# Patient Record
Sex: Male | Born: 1970 | Hispanic: Yes | Marital: Married | Smoking: Never smoker
Health system: Southern US, Community
[De-identification: ages and names within clinical notes are randomized; demographics above are authoritative.]

## PROBLEM LIST (undated history)

## (undated) DIAGNOSIS — E785 Hyperlipidemia, unspecified: Secondary | ICD-10-CM

## (undated) DIAGNOSIS — R1013 Epigastric pain: Secondary | ICD-10-CM

## (undated) DIAGNOSIS — M1990 Unspecified osteoarthritis, unspecified site: Secondary | ICD-10-CM

## (undated) DIAGNOSIS — K760 Fatty (change of) liver, not elsewhere classified: Secondary | ICD-10-CM

## (undated) DIAGNOSIS — G8929 Other chronic pain: Secondary | ICD-10-CM

## (undated) DIAGNOSIS — IMO0001 Reserved for inherently not codable concepts without codable children: Secondary | ICD-10-CM

## (undated) DIAGNOSIS — I10 Essential (primary) hypertension: Secondary | ICD-10-CM

## (undated) DIAGNOSIS — K824 Cholesterolosis of gallbladder: Secondary | ICD-10-CM

## (undated) DIAGNOSIS — A09 Infectious gastroenteritis and colitis, unspecified: Secondary | ICD-10-CM

## (undated) DIAGNOSIS — Z8711 Personal history of peptic ulcer disease: Secondary | ICD-10-CM

## (undated) DIAGNOSIS — R030 Elevated blood-pressure reading, without diagnosis of hypertension: Secondary | ICD-10-CM

## (undated) DIAGNOSIS — Z8719 Personal history of other diseases of the digestive system: Secondary | ICD-10-CM

## (undated) HISTORY — PX: UPPER GASTROINTESTINAL ENDOSCOPY: SHX188

## (undated) HISTORY — DX: Cholesterolosis of gallbladder: K82.4

## (undated) HISTORY — PX: NO PAST SURGERIES: SHX2092

## (undated) HISTORY — DX: Epigastric pain: R10.13

## (undated) HISTORY — DX: Infectious gastroenteritis and colitis, unspecified: A09

## (undated) HISTORY — DX: Reserved for inherently not codable concepts without codable children: IMO0001

## (undated) HISTORY — DX: Elevated blood-pressure reading, without diagnosis of hypertension: R03.0

## (undated) HISTORY — DX: Hyperlipidemia, unspecified: E78.5

## (undated) HISTORY — DX: Other chronic pain: G89.29

## (undated) HISTORY — DX: Personal history of peptic ulcer disease: Z87.11

## (undated) HISTORY — DX: Personal history of other diseases of the digestive system: Z87.19

---

## 2009-03-28 ENCOUNTER — Emergency Department (HOSPITAL_COMMUNITY): Admission: EM | Admit: 2009-03-28 | Discharge: 2009-03-28 | Payer: Self-pay | Admitting: Emergency Medicine

## 2009-04-30 ENCOUNTER — Ambulatory Visit: Payer: Self-pay | Admitting: Gastroenterology

## 2009-04-30 ENCOUNTER — Telehealth (INDEPENDENT_AMBULATORY_CARE_PROVIDER_SITE_OTHER): Payer: Self-pay | Admitting: *Deleted

## 2009-04-30 DIAGNOSIS — R1013 Epigastric pain: Secondary | ICD-10-CM

## 2009-05-14 ENCOUNTER — Encounter: Payer: Self-pay | Admitting: Gastroenterology

## 2009-05-14 ENCOUNTER — Ambulatory Visit: Payer: Self-pay | Admitting: Gastroenterology

## 2009-05-19 ENCOUNTER — Encounter: Payer: Self-pay | Admitting: Gastroenterology

## 2009-06-10 ENCOUNTER — Emergency Department (HOSPITAL_COMMUNITY): Admission: EM | Admit: 2009-06-10 | Discharge: 2009-06-10 | Payer: Self-pay | Admitting: Emergency Medicine

## 2010-12-03 LAB — CBC
MCHC: 34.5 g/dL (ref 30.0–36.0)
MCV: 84.8 fL (ref 78.0–100.0)
Platelets: 175 10*3/uL (ref 150–400)
RDW: 13.5 % (ref 11.5–15.5)
WBC: 6.6 10*3/uL (ref 4.0–10.5)

## 2010-12-03 LAB — COMPREHENSIVE METABOLIC PANEL
Alkaline Phosphatase: 57 U/L (ref 39–117)
GFR calc non Af Amer: >60 mL/min (ref >60)
Glucose, Bld: 99 mg/dL (ref 70–99)
Potassium: 3.7 mEq/L (ref 3.5–5.1)
Sodium: 141 mEq/L (ref 135–145)
Total Bilirubin: 0.6 mg/dL (ref 0.3–1.2)

## 2010-12-03 LAB — DIFFERENTIAL
Basophils Relative: 0 % (ref 0–1)
Eosinophils Relative: 2 % (ref 0–5)
Lymphocytes Relative: 37 % (ref 12–46)
Monocytes Absolute: 0.6 10*3/uL (ref 0.1–1.0)
Neutro Abs: 3.4 10*3/uL (ref 1.7–7.7)
Neutrophils Relative %: 52 % (ref 43–77)

## 2010-12-06 LAB — DIFFERENTIAL
Basophils Absolute: 0 10*3/uL (ref 0.0–0.1)
Basophils Relative: 0 % (ref 0–1)
Eosinophils Relative: 2 % (ref 0–5)
Lymphocytes Relative: 37 % (ref 12–46)
Lymphs Abs: 2.3 10*3/uL (ref 0.7–4.0)

## 2010-12-06 LAB — COMPREHENSIVE METABOLIC PANEL
ALT: 42 U/L (ref 0–53)
AST: 30 U/L (ref 0–37)
CO2: 29 mEq/L (ref 19–32)
Chloride: 104 mEq/L (ref 96–112)
GFR calc Af Amer: >60 mL/min (ref >60)
Glucose, Bld: 103 mg/dL — ABNORMAL HIGH (ref 70–99)
Potassium: 3.9 mEq/L (ref 3.5–5.1)
Sodium: 139 mEq/L (ref 135–145)

## 2010-12-06 LAB — URINALYSIS, ROUTINE W REFLEX MICROSCOPIC
Bilirubin Urine: NEGATIVE
Glucose, UA: NEGATIVE mg/dL
Hgb urine dipstick: NEGATIVE
Leukocytes, UA: NEGATIVE
Nitrite: NEGATIVE
Protein, ur: 30 mg/dL — AB
Specific Gravity, Urine: 1.028 (ref 1.005–1.030)
Urobilinogen, UA: 0.2 mg/dL (ref 0.0–1.0)

## 2010-12-06 LAB — CBC
Hemoglobin: 15.3 g/dL (ref 13.0–17.0)
WBC: 6.3 10*3/uL (ref 4.0–10.5)

## 2010-12-06 LAB — LIPASE, BLOOD: Lipase: 22 U/L (ref 11–59)

## 2010-12-06 LAB — URINE MICROSCOPIC-ADD ON

## 2012-07-30 ENCOUNTER — Emergency Department (HOSPITAL_COMMUNITY)
Admission: EM | Admit: 2012-07-30 | Discharge: 2012-07-30 | Disposition: A | Discharge status: Home / Self Care | Payer: Self-pay | Attending: Emergency Medicine | Admitting: Emergency Medicine

## 2012-07-30 ENCOUNTER — Encounter (HOSPITAL_COMMUNITY): Payer: Self-pay | Admitting: Emergency Medicine

## 2012-07-30 ENCOUNTER — Emergency Department (HOSPITAL_COMMUNITY): Payer: Self-pay

## 2012-07-30 DIAGNOSIS — S62639B Displaced fracture of distal phalanx of unspecified finger, initial encounter for open fracture: Secondary | ICD-10-CM

## 2012-07-30 DIAGNOSIS — S61313A Laceration without foreign body of left middle finger with damage to nail, initial encounter: Secondary | ICD-10-CM

## 2012-07-30 DIAGNOSIS — W230XXA Caught, crushed, jammed, or pinched between moving objects, initial encounter: Secondary | ICD-10-CM | POA: Insufficient documentation

## 2012-07-30 DIAGNOSIS — Y929 Unspecified place or not applicable: Secondary | ICD-10-CM | POA: Insufficient documentation

## 2012-07-30 DIAGNOSIS — Z23 Encounter for immunization: Secondary | ICD-10-CM | POA: Insufficient documentation

## 2012-07-30 DIAGNOSIS — Y9389 Activity, other specified: Secondary | ICD-10-CM | POA: Insufficient documentation

## 2012-07-30 DIAGNOSIS — S61209A Unspecified open wound of unspecified finger without damage to nail, initial encounter: Secondary | ICD-10-CM | POA: Insufficient documentation

## 2012-07-30 MED ORDER — LIDOCAINE HCL 2 % IJ SOLN
10.0000 mL | Freq: Once | INTRAMUSCULAR | Status: AC
  Administered 2012-07-30: 10 mg via INTRADERMAL
  Filled 2012-07-30: qty 20

## 2012-07-30 MED ORDER — OXYCODONE-ACETAMINOPHEN 5-325 MG PO TABS
1.0000 | ORAL_TABLET | Freq: Once | ORAL | Status: AC
  Administered 2012-07-30: 1 via ORAL
  Filled 2012-07-30: qty 1

## 2012-07-30 MED ORDER — OXYCODONE-ACETAMINOPHEN 5-325 MG PO TABS: 1.0000 | ORAL_TABLET | Freq: Four times a day (QID) | ORAL | Status: DC | PRN

## 2012-07-30 MED ORDER — CEPHALEXIN 500 MG PO CAPS: 500.0000 mg | ORAL_CAPSULE | Freq: Three times a day (TID) | ORAL | Status: DC

## 2012-07-30 MED ORDER — TETANUS-DIPHTH-ACELL PERTUSSIS 5-2.5-18.5 LF-MCG/0.5 IM SUSP
0.5000 mL | Freq: Once | INTRAMUSCULAR | Status: AC
  Administered 2012-07-30: 0.5 mL via INTRAMUSCULAR
  Filled 2012-07-30: qty 0.5

## 2012-07-30 NOTE — ED Notes (Signed)
Patient transported to X-ray 

## 2012-07-30 NOTE — ED Notes (Signed)
Pt states that he was fixing his garage door when he cut it.  Deep lac noted to lt middle finger.

## 2012-07-30 NOTE — ED Provider Notes (Signed)
History     CSN: [id number]  Arrival date & time 07/30/12  1352   First MD Initiated Contact with Patient 07/30/12 1420      Chief Complaint  Patient presents with  . Extremity Laceration    (Consider location/radiation/quality/duration/timing/severity/associated sxs/prior treatment) HPI  41 year old male presents for evaluation of finger laceration.  Pt is Spanish speaking, friend available at bedside acts as interpreter.  Pt reports he was fixing his garage door when it came of the hinge and the edge of the door smashed his L middle finger. Onset acute. Incident happened 30 min ago.  C/o sharp throbbing pain to affected area, but denies numbness.  Not UTD with tetanus.  No other injury.  No treatment tried.    History reviewed. No pertinent past medical history.  History reviewed. No pertinent past surgical history.  History reviewed. No pertinent family history.  History  Substance Use Topics  . Smoking status: Never Smoker   . Smokeless tobacco: Not on file  . Alcohol Use: No      Review of Systems  Constitutional: Negative for fever.  Skin: Positive for wound. Negative for rash.  Neurological: Negative for numbness.    Allergies  Review of patient's allergies indicates not on file.  Home Medications  No current outpatient prescriptions on file.  BP 139/90  Pulse 69  Temp 98.2 F (36.8 C) (Oral)  Resp 18  SpO2 99%  Physical Exam  Nursing note and vitals reviewed. Constitutional: He is oriented to person, place, and time. He appears well-developed and well-nourished. No distress.  HENT:  Head: Atraumatic.  Eyes: Conjunctivae normal are normal.  Neck: Neck supple.  Musculoskeletal: He exhibits tenderness (L middle finger: 2 cm circumferential laceration to medial aspect distal to DIP without nail involvement.  ttp.  brisk cap refill.  decreased ROM due to pain).  Neurological: He is alert and oriented to person, place, and time.  Skin: Skin is warm. No  rash noted.    ED Course  Procedures (including critical care time)  Dg Finger Middle Left  07/30/2012  *RADIOLOGY REPORT*  Clinical Data: Extremity and laceration.  Question gross door.  LEFT MIDDLE FINGER 2+V  Comparison: None.  Findings: There is a crush type injury of the tuft of the distal phalanx of the third finger.  The tuft is fractured into multiple small fragments, and distracted distally by approximately 3 mm. There is extensive soft tissue swelling and laceration adjacent to the fractures.   The joints of the hand are aligned.  IMPRESSION: Crush type comminuted fractures of the tuft of the distal phalanx of the third finger.  Adjacent soft tissue swelling/laceration.   Original Report Authenticated By: Britta Mccreedy, M.D.      LACERATION REPAIR Performed by: Fayrene Helper Authorized by: Fayrene Helper Consent: Verbal consent obtained. Risks and benefits: risks, benefits and alternatives were discussed Consent given by: patient Patient identity confirmed: provided demographic data Prepped and Draped in normal sterile fashion Wound explored  Laceration Location: L middle finger  Laceration Length: 2 cm  No Foreign Bodies seen or palpated  Anesthesia: digital block  Local anesthetic: lidocaine 2% w/out epinephrine  Anesthetic total: 8 ml  Irrigation method: syringe Amount of cleaning: standard  Skin closure: prolene 4.0  Number of sutures: 6  Technique: simple interrupted, sharp debridement using sterile scissor, approximation.    Patient tolerance: Patient tolerated the procedure well with no immediate complications.  1. Tuft fx of distal L 3rd finger, open fracture 2. Laceration  of L 3rd finger.    MDM  Pt injured his L middle finger when garage door hinge fell and the door smashed his finger.  Xray ordered, tdap given.    4:01 PM Xray shows crushed type comminuted fx of the tuft of the distal phalanx of the 3rd finger.  This is an open wound.  Wound were  thoroughly irrigated and sutured.  Will d/c with care instruction, finger splint, abx, pain meds, and f/u instruction to hand specialist.    BP 139/90  Pulse 69  Temp 98.2 F (36.8 C) (Oral)  Resp 18  SpO2 99%  I have reviewed nursing notes and vital signs. I personally reviewed the imaging tests through PACS system  I reviewed available ER/hospitalization records thought the EMR       Fayrene Helper, [place] 07/30/12 1603

## 2012-08-01 NOTE — ED Provider Notes (Signed)
Medical screening examination/treatment/procedure(s) were performed by non-physician practitioner and as supervising physician I was immediately available for consultation/collaboration.  Fredi Hurtado R. Victor Granados, MD 08/01/12 0011 

## 2014-05-22 ENCOUNTER — Encounter: Payer: Self-pay | Admitting: Gastroenterology

## 2014-06-14 ENCOUNTER — Ambulatory Visit (INDEPENDENT_AMBULATORY_CARE_PROVIDER_SITE_OTHER): Payer: BC Managed Care – PPO | Admitting: Internal Medicine

## 2014-06-14 ENCOUNTER — Encounter: Payer: Self-pay | Admitting: Internal Medicine

## 2014-06-14 VITALS — BP 147/89 | HR 77 | Temp 98.2°F | Ht 64.0 in | Wt 171.5 lb

## 2014-06-14 DIAGNOSIS — IMO0001 Reserved for inherently not codable concepts without codable children: Secondary | ICD-10-CM

## 2014-06-14 DIAGNOSIS — G8929 Other chronic pain: Secondary | ICD-10-CM

## 2014-06-14 DIAGNOSIS — R030 Elevated blood-pressure reading, without diagnosis of hypertension: Secondary | ICD-10-CM

## 2014-06-14 DIAGNOSIS — R1013 Epigastric pain: Secondary | ICD-10-CM

## 2014-06-14 DIAGNOSIS — I10 Essential (primary) hypertension: Secondary | ICD-10-CM | POA: Insufficient documentation

## 2014-06-14 HISTORY — DX: Other chronic pain: G89.29

## 2014-06-14 LAB — CBC WITH DIFFERENTIAL/PLATELET
BASOS PCT: 0.3 % (ref 0.0–3.0)
Basophils Absolute: 0 10*3/uL (ref 0.0–0.1)
Eosinophils Absolute: 0.2 10*3/uL (ref 0.0–0.7)
Eosinophils Relative: 2.1 % (ref 0.0–5.0)
HEMATOCRIT: 46.8 % (ref 39.0–52.0)
HEMOGLOBIN: 15.4 g/dL (ref 13.0–17.0)
Lymphocytes Relative: 33.6 % (ref 12.0–46.0)
Lymphs Abs: 2.8 10*3/uL (ref 0.7–4.0)
MCHC: 32.9 g/dL (ref 30.0–36.0)
MCV: 84.5 fl (ref 78.0–100.0)
MONO ABS: 0.5 10*3/uL (ref 0.1–1.0)
Monocytes Relative: 6.4 % (ref 3.0–12.0)
NEUTROS ABS: 4.8 10*3/uL (ref 1.4–7.7)
Neutrophils Relative %: 57.6 % (ref 43.0–77.0)
Platelets: 189 10*3/uL (ref 150.0–400.0)
RBC: 5.54 Mil/uL (ref 4.22–5.81)
RDW: 13.1 % (ref 11.5–15.5)
WBC: 8.4 10*3/uL (ref 4.0–10.5)

## 2014-06-14 LAB — COMPREHENSIVE METABOLIC PANEL
ALK PHOS: 57 U/L (ref 39–117)
ALT: 29 U/L (ref 0–53)
AST: 23 U/L (ref 0–37)
Albumin: 3.9 g/dL (ref 3.5–5.2)
BILIRUBIN TOTAL: 0.8 mg/dL (ref 0.2–1.2)
BUN: 10 mg/dL (ref 6–23)
CO2: 26 mEq/L (ref 19–32)
CREATININE: 0.9 mg/dL (ref 0.4–1.5)
Calcium: 9.6 mg/dL (ref 8.4–10.5)
Chloride: 101 mEq/L (ref 96–112)
GFR: 96.18 mL/min (ref >60.00)
GLUCOSE: 106 mg/dL — AB (ref 70–99)
Potassium: 3.7 mEq/L (ref 3.5–5.1)
Sodium: 138 mEq/L (ref 135–145)
Total Protein: 7.8 g/dL (ref 6.0–8.3)

## 2014-06-14 MED ORDER — PANTOPRAZOLE SODIUM 40 MG PO TBEC
40.0000 mg | DELAYED_RELEASE_TABLET | Freq: Every day | ORAL | Status: DC
Start: 2014-06-14 — End: 2014-11-08

## 2014-06-14 NOTE — Assessment & Plan Note (Addendum)
Several years history of abdominal pain, interestingly, EGD documented peptic ulcer per patient (~2010) Reports that both his father and brother died from stomach cancer. DDX is large but includes peptic ulcer disease, gastritis, others. Plan: Labs, abdominal ultrasound, Protonix, GI referral for consideration of endoscopy

## 2014-06-14 NOTE — Progress Notes (Signed)
Subjective:    Patient ID: Evan Freeman, male    DOB: 1971/05/31, 43 y.o.   MRN: [id number]  DOS:  06/14/2014 Type of visit - description : new pt 3 years history of abdominal pain, located at the epigastric area, feels like a burning, usually wakes him up in the middle of the night. Sometimes immediately after he has a bowel movement and then it decreases, bowel movements or sometimes loose or watery. He has a history of a ulcer years ago, more recently saw a doctor, was prescribed omeprazole, it helped to some extent. Abdominal pain is usually not changed by eating. On the last 2 weeks the symptoms have been present almost every night.    ROS Unsure if he had weight loss Denies chest or difficulty breathing No nausea vomiting. Admits to frequent heartburn, no dysphagia or odynophagia per se. No blood in the stools.   Past Medical History  Diagnosis Date  . Elevated BP   . Hyperlipidemia   . History of stomach ulcers     EGD 2010    Past Surgical History  Procedure Laterality Date  . No past surgeries      History   Social History  . Marital Status: Married    Spouse Name: N/A    Number of Children: 2  . Years of Education: N/A   Occupational History  . worker, Architect Other   Social History Main Topics  . Smoking status: Never Smoker   . Smokeless tobacco: Not on file  . Alcohol Use: Yes     Comment: rare  . Drug Use: No  . Sexual Activity: Not on file   Other Topics Concern  . Not on file   Social History Narrative   Original from [place]   7 grade education     Family History  Problem Relation Age of Onset  . Prostate cancer Neg Hx   . Colon cancer Neg Hx   . Gastric cancer Other     father, brother and cousin  . CAD Mother   . Diabetes Neg Hx        Medication List       This list is accurate as of: 06/14/14 11:59 PM.  Always use your most recent med list.               pantoprazole 40 MG tablet  Commonly known as:   PROTONIX  Take 1 tablet (40 mg total) by mouth daily.           Objective:   Physical Exam BP 147/89  Pulse 77  Temp(Src) 98.2 F (36.8 C) (Oral)  Ht 5\' 4"  (1.626 m)  Wt 171 lb 8 oz (77.792 kg)  BMI 29.42 kg/m2  SpO2 94% General -- alert, well-developed, NAD.  HEENT-- Not pale.   Lungs -- normal respiratory effort, no intercostal retractions, no accessory muscle use, and normal breath sounds.  Heart-- normal rate, regular rhythm, no murmur.  Abdomen-- Not distended, good bowel sounds,soft,  slt tender @ epigastrium but nNo rebound or rigidity. No mass,organomegaly Extremities-- no pretibial edema bilaterally  Neurologic--  alert & oriented X3. Speech normal, gait appropriate for age, strength symmetric and appropriate for age.   Psych-- Cognition and judgment appear intact. Cooperative with normal attention span and concentration. No anxious or depressed appearing.        Assessment & Plan:   Also complained of  R arm numbness from time to time, usually dependent on  his position; for  instance when he drives  for prolonged periods of time. That is not his main concern at this time and will address in a future visit. Denies neck pain

## 2014-06-14 NOTE — Progress Notes (Signed)
Pre visit review using our clinic review tool, if applicable. No additional management support is needed unless otherwise documented below in the visit note. 

## 2014-06-14 NOTE — Patient Instructions (Signed)
Get your blood work before you leave     Please come back to the office in 3 months for a routine check up   [place] para una cita con el especialista del estomago  Debe hacerse un ultrasonido de el abdomen   regrese en 2 meses  Tome pantoprazole todos los dias antes del desayuno

## 2014-06-15 NOTE — Assessment & Plan Note (Signed)
Recheck on RTC

## 2014-06-18 ENCOUNTER — Encounter: Payer: Self-pay | Admitting: Internal Medicine

## 2014-06-18 ENCOUNTER — Ambulatory Visit (HOSPITAL_BASED_OUTPATIENT_CLINIC_OR_DEPARTMENT_OTHER): Payer: BC Managed Care – PPO

## 2014-07-05 ENCOUNTER — Ambulatory Visit (HOSPITAL_BASED_OUTPATIENT_CLINIC_OR_DEPARTMENT_OTHER)
Admission: RE | Admit: 2014-07-05 | Discharge: 2014-07-05 | Disposition: A | Discharge status: Home / Self Care | Payer: BC Managed Care – PPO | Source: Ambulatory Visit | Attending: Radiology | Admitting: Radiology

## 2014-07-05 DIAGNOSIS — G8929 Other chronic pain: Secondary | ICD-10-CM

## 2014-07-05 DIAGNOSIS — K829 Disease of gallbladder, unspecified: Secondary | ICD-10-CM | POA: Diagnosis not present

## 2014-07-05 DIAGNOSIS — N281 Cyst of kidney, acquired: Secondary | ICD-10-CM | POA: Diagnosis not present

## 2014-07-05 DIAGNOSIS — K760 Fatty (change of) liver, not elsewhere classified: Secondary | ICD-10-CM | POA: Diagnosis not present

## 2014-07-05 DIAGNOSIS — R1013 Epigastric pain: Secondary | ICD-10-CM | POA: Diagnosis present

## 2014-08-05 ENCOUNTER — Ambulatory Visit (INDEPENDENT_AMBULATORY_CARE_PROVIDER_SITE_OTHER): Payer: BC Managed Care – PPO | Admitting: Gastroenterology

## 2014-08-05 ENCOUNTER — Encounter: Payer: Self-pay | Admitting: Gastroenterology

## 2014-08-05 VITALS — BP 136/82 | HR 74 | Ht 63.75 in | Wt 174.0 lb

## 2014-08-05 DIAGNOSIS — R1013 Epigastric pain: Secondary | ICD-10-CM

## 2014-08-05 DIAGNOSIS — G8929 Other chronic pain: Secondary | ICD-10-CM

## 2014-08-05 DIAGNOSIS — K824 Cholesterolosis of gallbladder: Secondary | ICD-10-CM

## 2014-08-05 HISTORY — DX: Cholesterolosis of gallbladder: K82.4

## 2014-08-05 MED ORDER — GLYCOPYRROLATE 2 MG PO TABS: ORAL_TABLET | ORAL | Status: DC

## 2014-08-05 NOTE — Patient Instructions (Signed)

## 2014-08-05 NOTE — Addendum Note (Signed)
Addended by: Oda Kilts on: 08/05/2014 03:59 PM   Modules accepted: Orders

## 2014-08-05 NOTE — Assessment & Plan Note (Signed)
This is probably an incidental finding.  Polyps less than 10 mm usually follow a benign course.  Since the incidence of gallbladder cancer increases in polyps exceeding 10 mm would recommend elective cholecystectomy, should this occur.  In the interim plan to repeat ultrasound in 6 months to evaluate polyp size.

## 2014-08-05 NOTE — Progress Notes (Signed)
_                                                                                                                History of Present Illness:  Mr. Evan Freeman is a 43 year old Hispanic male referred for evaluation of abdominal pain.  History was obtained through translator.  For several years he's been complaining of vague upper abdominal pain that may radiate to the umbilicus.  He often awakens with pain.  It is not particularly affected by eating or by position.  He's on no gastric irritants including nonsteroidals.  Empiric therapy with pantoprazole resulted in decrease in pyrosis but no change in the pain.  Abdominal ultrasound, which I reviewed, demonstrated a probable 8 mm gallbladder polyp.  There was mild hepatic steatosis.  He apparently had a gastric ulcer over 5 years ago diagnosed by endoscopy.  Family history is pertinent for a brother and father who both had gastric cancer in their 79s.  The patient immigrated from [place] several years ago.   Past Medical History  Diagnosis Date  . Elevated BP   . Hyperlipidemia   . History of stomach ulcers     EGD 2010  . Infectious colitis    Past Surgical History  Procedure Laterality Date  . Ulcer surgery     family history includes CAD in his mother; Gastric cancer in his other; Stomach cancer in his brother and father. There is no history of Prostate cancer, Colon cancer, Diabetes, or Esophageal cancer. Current Outpatient Prescriptions  Medication Sig Dispense Refill  . pantoprazole (PROTONIX) 40 MG tablet Take 1 tablet (40 mg total) by mouth daily. 30 tablet 3   No current facility-administered medications for this visit.   Allergies as of 08/05/2014  . (No Known Allergies)    reports that he has never smoked. He has never used smokeless tobacco. He reports that he drinks alcohol. He reports that he does not use illicit drugs.   Review of Systems: Pertinent positive and negative review of systems were noted in  the above HPI section. All other review of systems were otherwise negative.  Vital signs were reviewed in today's medical record Physical Exam: General: Well developed , well nourished, no acute distress Skin: anicteric Head: Normocephalic and atraumatic Eyes:  sclerae anicteric, EOMI Ears: Normal auditory acuity Mouth: No deformity or lesions Neck: Supple, no masses or thyromegaly Lungs: Clear throughout to auscultation Heart: Regular rate and rhythm; no murmurs, rubs or bruits Abdomen: Soft, and non distended. No masses, hepatosplenomegaly or hernias noted. Normal Bowel sounds.  There is minimal tenderness to palpation in the midepigastrium Rectal:deferred Musculoskeletal: Symmetrical with no gross deformities  Skin: No lesions on visible extremities Pulses:  Normal pulses noted Extremities: No clubbing, cyanosis, edema or deformities noted Neurological: Alert oriented x 4, grossly nonfocal Cervical Nodes:  No significant cervical adenopathy Inguinal Nodes: No significant inguinal adenopathy Psychological:  Alert and cooperative. Normal mood and affect  See Assessment and Plan under Problem List

## 2014-08-05 NOTE — Assessment & Plan Note (Signed)
Several year history of low-grade but persistent epigastric pain.  Symptoms could be due to ulcer or nonulcer dyspepsia.  Patient has a gallbladder polyp but I think this is an incidental finding.  No family history of gastric cancer in father and brother in their early 64s.  Recommendations #1 upper endoscopy #2 trial of hyomax #3 if not improved and endoscopy is negative I will obtain a HIDA scan

## 2014-08-15 ENCOUNTER — Encounter: Payer: Self-pay | Admitting: Gastroenterology

## 2014-09-27 ENCOUNTER — Encounter: Payer: Self-pay | Admitting: Gastroenterology

## 2014-09-27 ENCOUNTER — Ambulatory Visit (AMBULATORY_SURGERY_CENTER): Payer: 59 | Admitting: Gastroenterology

## 2014-09-27 VITALS — BP 145/99 | HR 57 | Temp 97.2°F | Resp 17 | Ht 63.5 in | Wt 174.0 lb

## 2014-09-27 DIAGNOSIS — R1013 Epigastric pain: Secondary | ICD-10-CM

## 2014-09-27 DIAGNOSIS — K2990 Gastroduodenitis, unspecified, without bleeding: Secondary | ICD-10-CM

## 2014-09-27 DIAGNOSIS — K2970 Gastritis, unspecified, without bleeding: Secondary | ICD-10-CM

## 2014-09-27 DIAGNOSIS — K2950 Unspecified chronic gastritis without bleeding: Secondary | ICD-10-CM

## 2014-09-27 MED ORDER — SODIUM CHLORIDE 0.9 % IV SOLN: 500.0000 mL | INTRAVENOUS | Status: DC

## 2014-09-27 NOTE — Progress Notes (Signed)
Called to room to assist during endoscopic procedure.  Patient ID and intended procedure confirmed with present staff. Received instructions for my participation in the procedure from the performing physician.  

## 2014-09-27 NOTE — Progress Notes (Signed)
Report to PACU, RN, vss, BBS= Clear.  

## 2014-09-27 NOTE — Op Note (Signed)
[facility]  [address] [place], [address]   ENDOSCOPY PROCEDURE REPORT  PATIENT: Evan, Freeman  MR#: [id number] BIRTHDATE: [birth year] , 43  yrs. old GENDER: male ENDOSCOPIST: Inda Castle, MD REFERRED BY:  Kathlene November, M.D. PROCEDURE DATE:  09/27/2014 PROCEDURE:  EGD w/ biopsy ASA CLASS:     Class II INDICATIONS:  epigastric pain. MEDICATIONS: Monitored anesthesia care and Propofol 200 mg IV TOPICAL ANESTHETIC:  DESCRIPTION OF PROCEDURE: After the risks benefits and alternatives of the procedure were thoroughly explained, informed consent was obtained.  The LB [id number] endoscope was introduced through the mouth and advanced to the second portion of the duodenum , Without limitations.  The instrument was slowly withdrawn as the mucosa was fully examined.    Enlarged gastric folds with erosions, fundus.   Enlarged gastric folds, fundus.   Gastric erosions.  In the gastric fundus there is several enlarged but soft.  Erythematous with multiple superficial erosions Multiple biopsies were performed.  Retroflexed views revealed no abnormalities.     The scope was then withdrawn from the patient and the procedure completed.  COMPLICATIONS: There were no immediate complications.  ENDOSCOPIC IMPRESSION: 1.   Enlarged gastric folds with erosions, fundus 2.   Enlarged gastric folds, fundus 3.   Gastric erosions 4.   Multiple biopsies were performed  RECOMMENDATIONS: 1.  Continue current meds 2.  Await biopsy results  REPEAT EXAM:  eSigned:  Inda Castle, MD 09/27/2014 2:38 PM    CC:  PATIENT NAME:  Evan Freeman, Evan Freeman MR#: [id number]

## 2014-09-27 NOTE — Patient Instructions (Signed)
Impressions/recommendations:  Gastric erosions Pending biopsy results.  Continue current medications YOU HAD AN ENDOSCOPIC PROCEDURE TODAY AT [facility]: Refer to the procedure report that was given to you for any specific questions about what was found during the examination.  If the procedure report does not answer your questions, please call your gastroenterologist to clarify.  If you requested that your care partner not be given the details of your procedure findings, then the procedure report has been included in a sealed envelope for you to review at your convenience later.  YOU SHOULD EXPECT: Some feelings of bloating in the abdomen. Passage of more gas than usual.  Walking can help get rid of the air that was put into your GI tract during the procedure and reduce the bloating. If you had a lower endoscopy (such as a colonoscopy or flexible sigmoidoscopy) you may notice spotting of blood in your stool or on the toilet paper. If you underwent a bowel prep for your procedure, then you may not have a normal bowel movement for a few days.  DIET: Your first meal following the procedure should be a light meal and then it is ok to progress to your normal diet.  A half-sandwich or bowl of soup is an example of a good first meal.  Heavy or fried foods are harder to digest and may make you feel nauseous or bloated.  Likewise meals heavy in dairy and vegetables can cause extra gas to form and this can also increase the bloating.  Drink plenty of fluids but you should avoid alcoholic beverages for 24 hours.  ACTIVITY: Your care partner should take you home directly after the procedure.  You should plan to take it easy, moving slowly for the rest of the day.  You can resume normal activity the day after the procedure however you should NOT DRIVE or use heavy machinery for 24 hours (because of the sedation medicines used during the test).    SYMPTOMS TO REPORT IMMEDIATELY: A  gastroenterologist can be reached at any hour.  During normal business hours, 8:30 AM to 5:00 PM Monday through Friday, call [phone].  After hours and on weekends, please call the GI answering service at [phone] who will take a message and have the physician on call contact you.   Following upper endoscopy (EGD)  Vomiting of blood or coffee ground material  New chest pain or pain under the shoulder blades  Painful or persistently difficult swallowing  New shortness of breath  Fever of 100F or higher  Black, tarry-looking stools  FOLLOW UP: If any biopsies were taken you will be contacted by phone or by letter within the next 1-3 weeks.  Call your gastroenterologist if you have not heard about the biopsies in 3 weeks.  Our staff will call the home number listed on your records the next business day following your procedure to check on you and address any questions or concerns that you may have at that time regarding the information given to you following your procedure. This is a courtesy call and so if there is no answer at the home number and we have not heard from you through the emergency physician on call, we will assume that you have returned to your regular daily activities without incident.  SIGNATURES/CONFIDENTIALITY: You and/or your care partner have signed paperwork which will be entered into your electronic medical record.  These signatures attest to the fact that that the information above on your After  Visit Summary has been reviewed and is understood.  Full responsibility of the confidentiality of this discharge information lies with you and/or your care-partner. 

## 2014-09-30 ENCOUNTER — Telehealth: Payer: Self-pay | Admitting: *Deleted

## 2014-09-30 NOTE — Telephone Encounter (Signed)
  Follow up Call-  Call back number 09/27/2014  Post procedure Call Back phone  # [phone]  Permission to leave phone message Yes     Patient questions:  Do you have a fever, pain , or abdominal swelling? No. Pain Score  0 *  Have you tolerated food without any problems? Yes.    Have you been able to return to your normal activities? Yes.    Do you have any questions about your discharge instructions: Diet   No. Medications  No. Follow up visit  No.  Do you have questions or concerns about your Care? No.  Actions: * If pain score is 4 or above: No action needed, pain <4.

## 2014-10-02 ENCOUNTER — Encounter: Payer: Self-pay | Admitting: Gastroenterology

## 2014-11-06 ENCOUNTER — Encounter (HOSPITAL_COMMUNITY): Payer: Self-pay | Admitting: Emergency Medicine

## 2014-11-08 ENCOUNTER — Encounter: Payer: Self-pay | Admitting: Internal Medicine

## 2014-11-08 ENCOUNTER — Ambulatory Visit (INDEPENDENT_AMBULATORY_CARE_PROVIDER_SITE_OTHER): Payer: 59 | Admitting: Internal Medicine

## 2014-11-08 VITALS — BP 138/76 | HR 70 | Temp 98.2°F | Ht 64.0 in | Wt 176.4 lb

## 2014-11-08 DIAGNOSIS — M7710 Lateral epicondylitis, unspecified elbow: Secondary | ICD-10-CM

## 2014-11-08 DIAGNOSIS — R1013 Epigastric pain: Secondary | ICD-10-CM

## 2014-11-08 MED ORDER — PANTOPRAZOLE SODIUM 40 MG PO TBEC: 40.0000 mg | DELAYED_RELEASE_TABLET | Freq: Every day | ORAL | Status: DC

## 2014-11-08 NOTE — Progress Notes (Signed)
Pre visit review using our clinic review tool, if applicable. No additional management support is needed unless otherwise documented below in the visit note. 

## 2014-11-08 NOTE — Progress Notes (Signed)
   Subjective:    Patient ID: Evan Freeman, male    DOB: 1971/01/25, 44 y.o.   MRN: [id number]  DOS:  11/08/2014 Type of visit - description : f/u Interval history: Was seen with abdominal pain, saw GI, chart reviewed. Also continue with pain at the right elbow, mostly when he has to use his hands. Sometimes has right hand numbness, the whole hand gets numb, usually related to heavy work with his hands such as grabbing or using a hammer.    Review of Systems No neck pain per se. No elbow or hand injury  Past Medical History  Diagnosis Date  . Elevated BP   . Hyperlipidemia   . History of stomach ulcers     EGD 2010  . Infectious colitis     Past Surgical History  Procedure Laterality Date  . Ulcer surgery      History   Social History  . Marital Status: Married    Spouse Name: N/A  . Number of Children: 2  . Years of Education: N/A   Occupational History  . worker, Architect Other   Social History Main Topics  . Smoking status: Never Smoker   . Smokeless tobacco: Never Used  . Alcohol Use: 0.0 oz/week    0 Standard drinks or equivalent per week     Comment: rare  . Drug Use: No  . Sexual Activity: Not on file   Other Topics Concern  . Not on file   Social History Narrative   ** Merged History Encounter **       Original from [place]   7 grade education        Medication List       This list is accurate as of: 11/08/14 11:59 PM.  Always use your most recent med list.               glycopyrrolate 2 MG tablet  Commonly known as:  ROBINUL  Take one tablet 3 times a day for 5 days then as needed, abdominal pain     pantoprazole 40 MG tablet  Commonly known as:  PROTONIX  Take 1 tablet (40 mg total) by mouth daily.           Objective:   Physical Exam BP 138/76 mmHg  Pulse 70  Temp(Src) 98.2 F (36.8 C) (Oral)  Ht 5\' 4"  (1.626 m)  Wt 176 lb 6 oz (80.003 kg)  BMI 30.26 kg/m2  SpO2 98%  General:   Well developed, well  nourished . NAD.  Neck-- full range of motion, no TTP Muscle skeletal: no pretibial edema bilaterally  Inspection, palpation and range of motion of hands, elbows and wrists normal Skin: Not pale. Not jaundice Neurologic:  alert & oriented X3.  Speech normal, gait appropriate for age and unassisted DTRs symmetric Psych--  Cognition and judgment appear intact.  Cooperative with normal attention span and concentration.  Behavior appropriate. No anxious or depressed appearing.      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 Recommend a trial with CTS brace.

## 2014-11-08 NOTE — Patient Instructions (Addendum)
consiga un: Tennis Elbow Brace y uselo en el brazo derecho tambien un Carpal tunnel syndrome brace , uselo en la noche   si no se mejora, llamenos en 2 meses  regrese a hacerse un examen fisico en 6 meses     Come back to the office in 6 months  for a physical exam  Please schedule an appointment at the front desk    Come back fasting

## 2014-11-08 NOTE — Assessment & Plan Note (Addendum)
Since the last visit, had EGD, it showed erosions, biopsy showed no H. pylori, he has chronic erosive gastrittis. Ultrasound show a gallbladder polyp, felt to be incidental finding. Also fatty liver, this diagnosis was discussed with the patient. Taking PPIs, feels much improved. Wonders if he needs to continue with PPIs Plan: Refill PPIs for one month, will communicate with GI, continue pantoprazole? Addendum, discuss with GI. Will switch PPIs  to every other day and then as needed. See letter

## 2014-11-13 ENCOUNTER — Telehealth: Payer: Self-pay

## 2014-11-13 NOTE — Telephone Encounter (Signed)
Letter printed and mailed to Pt.  

## 2014-11-13 NOTE — Telephone Encounter (Signed)
-----   Message from Colon Branch, MD sent at 11/13/2014  8:44 AM EDT ----- Regarding: Send a letter "PPI instructions" Evan Freeman, I talked with our GI specialist, the recommendation is to take Protonix every other day for the next month and then only as needed for stomach problems. If   you have increased symptoms, please let us know. HABLE CON EL ESPECIALISTA , ME DIJO QUE DEBES TOMAR PROTONIX CADA OTRO DIA POR UN MES, [facility] SOLO SI LO [name]. SI LOS PROBLEMAS DE [name]

## 2014-12-20 ENCOUNTER — Telehealth: Payer: Self-pay | Admitting: *Deleted

## 2014-12-20 ENCOUNTER — Encounter: Payer: Self-pay | Admitting: Gastroenterology

## 2014-12-20 DIAGNOSIS — K824 Cholesterolosis of gallbladder: Secondary | ICD-10-CM

## 2014-12-20 NOTE — Telephone Encounter (Signed)
==  View-only below this line===  ----- Message -----    From: Oda Kilts, CMA    Sent: 11/29/2014      To: Oda Kilts, CMA Subject: Ultrasound in 5 months                         Patient needs 5 month follow up US abdomen for gallbladder polyp    You have been scheduled for an abdominal ultrasound at [facility] (1st floor of hospital) on May 3  at [place]. Please arrive 15 minutes prior to your appointment for registration. Make certain not to have anything to eat or drink 6 hours prior to your appointment. Should you need to reschedule your appointment, please contact radiology at [phone]. This test typically takes about 30 minutes to perform.   Could not get in touch with the patient sent letter to patient today

## 2014-12-31 ENCOUNTER — Ambulatory Visit (HOSPITAL_COMMUNITY): Payer: 59

## 2015-04-21 ENCOUNTER — Telehealth: Payer: Self-pay | Admitting: Internal Medicine

## 2015-04-21 NOTE — Telephone Encounter (Signed)
Pre visit letter mailed 04/21/15

## 2015-05-09 ENCOUNTER — Telehealth: Payer: Self-pay | Admitting: *Deleted

## 2015-05-09 ENCOUNTER — Encounter: Payer: Self-pay | Admitting: *Deleted

## 2015-05-09 NOTE — Telephone Encounter (Signed)
Pre-Visit Call completed with patient and [place] # [phone] and chart updated.   Pre-Visit Info documented in Specialty Comments under SnapShot.

## 2015-05-12 ENCOUNTER — Ambulatory Visit (INDEPENDENT_AMBULATORY_CARE_PROVIDER_SITE_OTHER): Payer: 59 | Admitting: Internal Medicine

## 2015-05-12 ENCOUNTER — Encounter: Payer: Self-pay | Admitting: Internal Medicine

## 2015-05-12 VITALS — BP 106/74 | HR 58 | Temp 98.4°F | Ht 64.0 in | Wt 182.2 lb

## 2015-05-12 DIAGNOSIS — Z0000 Encounter for general adult medical examination without abnormal findings: Secondary | ICD-10-CM | POA: Diagnosis not present

## 2015-05-12 DIAGNOSIS — Z09 Encounter for follow-up examination after completed treatment for conditions other than malignant neoplasm: Secondary | ICD-10-CM

## 2015-05-12 DIAGNOSIS — R739 Hyperglycemia, unspecified: Secondary | ICD-10-CM

## 2015-05-12 DIAGNOSIS — Z114 Encounter for screening for human immunodeficiency virus [HIV]: Secondary | ICD-10-CM

## 2015-05-12 LAB — LIPID PANEL
CHOL/HDL RATIO: 6
CHOLESTEROL: 195 mg/dL (ref 0–200)
HDL: 34.5 mg/dL — ABNORMAL LOW (ref >39.00)
NONHDL: 160.35
Triglycerides: 231 mg/dL — ABNORMAL HIGH (ref 0.0–149.0)
VLDL: 46.2 mg/dL — ABNORMAL HIGH (ref 0.0–40.0)

## 2015-05-12 LAB — AST: AST: 22 U/L (ref 0–37)

## 2015-05-12 LAB — HIV ANTIBODY (ROUTINE TESTING W REFLEX): HIV: NONREACTIVE

## 2015-05-12 LAB — TSH: TSH: 1.06 u[IU]/mL (ref 0.35–4.50)

## 2015-05-12 LAB — LDL CHOLESTEROL, DIRECT: Direct LDL: 105 mg/dL

## 2015-05-12 LAB — HEMOGLOBIN A1C: HEMOGLOBIN A1C: 5.9 % (ref 4.6–6.5)

## 2015-05-12 LAB — ALT: ALT: 33 U/L (ref 0–53)

## 2015-05-12 MED ORDER — PANTOPRAZOLE SODIUM 40 MG PO TBEC: 40.0000 mg | DELAYED_RELEASE_TABLET | Freq: Every day | ORAL | Status: DC

## 2015-05-12 NOTE — Progress Notes (Signed)
Subjective:    Patient ID: Evan Freeman, male    DOB: 1971-03-19, 44 y.o.   MRN: [id number]  DOS:  05/12/2015 Type of visit - description :  CPX Interval history:  Has some concerns, see review f systems    Review of Systems  Constitutional: No fever. No chills. No unexplained wt changes. No unusual sweats  HEENT: No dental problems, no ear discharge, no facial swelling, no voice changes. No eye discharge, no eye  redness , no  intolerance to light   Respiratory: No wheezing , no  difficulty breathing. No cough , no mucus production  Cardiovascular: No CP, no leg swelling , no  Palpitations  GI: no nausea, no vomiting, no diarrhea Continue with occasional epigastric discomfort, not taking PPIs, no classic heartburn, reports a verybad taste in mouth every morning.   Endocrine: No polyphagia, no polyuria , no polydipsia  GU: No dysuria, gross hematuria, difficulty urinating. No urinary urgency, no frequency.  Musculoskeletal: No joint swellings , some pain at the right lower back for 10 days, no radiation. Skin: No change in the color of the skin, palor , no  Rash  Allergic, immunologic: No environmental allergies , no  food allergies  Neurological: No dizziness no  syncope. No headaches. No diplopia, no slurred, no slurred speech, no motor deficits. Continue with occasional right hand numbness, mostly at night, at both the radial and cubital sides.  Denies any neck, elbow or wrist pain.  Hematological: No enlarged lymph nodes, no easy bruising , no unusual bleedings  Psychiatry: No suicidal ideas, no hallucinations, no beavior problems, no confusion.  No unusual/severe anxiety, no depression    Past Medical History  Diagnosis Date  . Elevated BP   . Hyperlipidemia   . History of stomach ulcers     EGD 2010  . Infectious colitis     Past Surgical History  Procedure Laterality Date  . No past surgeries      Social History   Social History  . Marital  Status: Married    Spouse Name: N/A  . Number of Children: 2  . Years of Education: N/A   Occupational History  . worker, Architect Other   Social History Main Topics  . Smoking status: Never Smoker   . Smokeless tobacco: Never Used  . Alcohol Use: 0.0 oz/week    0 Standard drinks or equivalent per week     Comment: rare  . Drug Use: No  . Sexual Activity: Not on file   Other Topics Concern  . Not on file   Social History Narrative   Original from [place]   7 grade education   Household-- pt , wife, daughter; has an adult son     Family History  Problem Relation Age of Onset  . Prostate cancer Neg Hx   . Colon cancer Neg Hx   . Gastric cancer Other     father, brother and cousin  . CAD Mother   . Diabetes Neg Hx   . Esophageal cancer Neg Hx   . Stomach cancer Father   . Stomach cancer Brother       Medication List       This list is accurate as of: 05/12/15 11:59 PM.  Always use your most recent med list.               pantoprazole 40 MG tablet  Commonly known as:  PROTONIX  Take 1 tablet (40 mg total) by mouth daily.  Objective:   Physical Exam BP 106/74 mmHg  Pulse 58  Temp(Src) 98.4 F (36.9 C) (Oral)  Ht 5\' 4"  (1.626 m)  Wt 182 lb 4 oz (82.668 kg)  BMI 31.27 kg/m2  SpO2 97% General:   Well developed, well nourished . NAD.  Neck:  Full range of motion. Supple. No  thyromegaly , normal carotid pulse HEENT:  Normocephalic . Face symmetric, atraumatic Lungs:  CTA B Normal respiratory effort, no intercostal retractions, no accessory muscle use. Heart: RRR,  no murmur.  No pretibial edema bilaterally  Abdomen:  Not distended, soft, non-tender. No rebound or rigidity.  Skin: Exposed areas without rash. Not pale. Not jaundice Neurologic:  alert & oriented X3.  Speech normal, gait appropriate for age and unassisted Strength symmetric and appropriate for age.    Psych: Cognition and judgment appear intact.  Cooperative with  normal attention span and concentration.  Behavior appropriate. No anxious or depressed appearing.    Assessment & Plan:   Problem List > Dyslipidemia H/o Elevated BP Chronic epigastric pain --EGD C~2010 PUD per Pt --CT abd neg 2010 --EGD 08-2014 Dr Deatra Ina: gastric erosions (bx chronic gastritis HP neg) , Rx PPI x 3 months , then prn Gallbladder polyps per [place] 06-2014, saw GI, incidental finding Fatty liver per [place] 06-2014, normal LFTs  A/P Chronic epigastric pain, likely from chronic gastritis, recommend PPIs Carpal tunnel syndrome? Has chronic numbness in the right hand, recommend CTS brace. Patient states symptoms are mild and declined further eval. Gallbladder polyps: 8 millimeters per report, repeat ultrasound 07-2015.

## 2015-05-12 NOTE — Patient Instructions (Addendum)
Get your blood work before you leave     Next visit  for a physical  exam in one year  Please schedule an appointment at the front desk Please come back fasting       Tome pantoprazole todos los dias antes del desayuno  Le vamos a tomar un ultrasonido en noviembre  regrese el proximo an~o para su examen genera;

## 2015-05-12 NOTE — Progress Notes (Signed)
Pre visit review using our clinic review tool, if applicable. No additional management support is needed unless otherwise documented below in the visit note. 

## 2015-05-12 NOTE — Assessment & Plan Note (Signed)
Td 2013, declined a flu shot Colon cancer screening: Not indicated Prostate cancer screening: Not indicated Diet and exercise discussed Labs: Cholesterol, AST, ALT, TSH, HIV and A1c.

## 2015-05-13 DIAGNOSIS — Z09 Encounter for follow-up examination after completed treatment for conditions other than malignant neoplasm: Secondary | ICD-10-CM | POA: Insufficient documentation

## 2015-05-13 NOTE — Assessment & Plan Note (Signed)
Chronic epigastric pain, likely from chronic gastritis, recommend PPIs Carpal tunnel syndrome? Has chronic numbness in the right hand, recommend CTS brace. Patient states symptoms are mild and declined further eval. Gallbladder polyps: 8 millimeters per report, repeat ultrasound 07-2015.

## 2015-08-10 ENCOUNTER — Telehealth: Payer: Self-pay | Admitting: Internal Medicine

## 2015-08-10 DIAGNOSIS — K824 Cholesterolosis of gallbladder: Secondary | ICD-10-CM

## 2015-08-10 NOTE — Telephone Encounter (Signed)
The patient needs a ultrasound of the abdomen, please arrange. I believe the  order is already in, if that is not the case let me know

## 2015-10-13 NOTE — Telephone Encounter (Signed)
Apparently nobody called him, please arrange

## 2015-10-14 NOTE — Telephone Encounter (Signed)
Looks like US Abdomen Complete was ordered by Dr. Deatra Ina in 11/2014. I have reordered the ultrasound under your name.

## 2015-10-14 NOTE — Telephone Encounter (Signed)
thx

## 2015-10-17 ENCOUNTER — Ambulatory Visit (HOSPITAL_BASED_OUTPATIENT_CLINIC_OR_DEPARTMENT_OTHER): Payer: Self-pay

## 2015-11-28 ENCOUNTER — Ambulatory Visit (HOSPITAL_BASED_OUTPATIENT_CLINIC_OR_DEPARTMENT_OTHER)
Admission: RE | Admit: 2015-11-28 | Discharge: 2015-11-28 | Disposition: A | Discharge status: Home / Self Care | Payer: BLUE CROSS/BLUE SHIELD | Source: Ambulatory Visit | Attending: Internal Medicine | Admitting: Internal Medicine

## 2015-11-28 DIAGNOSIS — K824 Cholesterolosis of gallbladder: Secondary | ICD-10-CM | POA: Diagnosis present

## 2016-05-14 ENCOUNTER — Encounter: Payer: Self-pay | Admitting: Internal Medicine

## 2016-06-01 ENCOUNTER — Encounter: Payer: BLUE CROSS/BLUE SHIELD | Admitting: Internal Medicine

## 2016-06-01 ENCOUNTER — Encounter: Payer: Self-pay | Admitting: Internal Medicine

## 2016-06-01 ENCOUNTER — Ambulatory Visit (INDEPENDENT_AMBULATORY_CARE_PROVIDER_SITE_OTHER): Payer: BLUE CROSS/BLUE SHIELD | Admitting: Internal Medicine

## 2016-06-01 VITALS — BP 120/76 | HR 72 | Temp 98.1°F | Resp 14 | Ht 64.0 in | Wt 182.1 lb

## 2016-06-01 DIAGNOSIS — Z0001 Encounter for general adult medical examination with abnormal findings: Secondary | ICD-10-CM

## 2016-06-01 DIAGNOSIS — Z0000 Encounter for general adult medical examination without abnormal findings: Secondary | ICD-10-CM

## 2016-06-01 DIAGNOSIS — M542 Cervicalgia: Secondary | ICD-10-CM

## 2016-06-01 MED ORDER — PREDNISONE 10 MG PO TABS: ORAL_TABLET | ORAL | 0 refills | Status: DC

## 2016-06-01 NOTE — Assessment & Plan Note (Addendum)
Td 2013, declined a flu shot Colon cancer screening: Not indicated Prostate cancer screening: Not indicated Diet and exercise discussed Labs: FLP, CMP, CBC, A1c (not fasting)

## 2016-06-01 NOTE — Patient Instructions (Addendum)
GO TO THE LAB : Get the blood work     GO TO THE FRONT DESK Schedule your next appointment for a  complete physical exam in one year, fasting.  ---  REGRESE EN UN AN~O PARA SU CHEQUEO GENERAL, LLAME ANTES SI ME NECESITA  TOME PREDNISONA     4 TABLETAS X 2 DIAS, 3X2, 2X2, 1X2  TAMBIEN TYLENOL 500 MG: 2 TABLETAS CADA 8 HORAS  PONGASE UNA COMPRESA CALIENTE   LLAME EN 10 DIAS SI NO MEJORA

## 2016-06-01 NOTE — Progress Notes (Signed)
Subjective:    Patient ID: Evan Freeman, male    DOB: 1971/07/12, 45 y.o.   MRN: [id number]  DOS:  06/01/2016 Type of visit - description : CPX Interval history: In addition to a CPX, he complains of neck pain for 10 days, pain encompass left side of the neck, left shoulder and deltoid. Worse with certain neck movements, pain is steady, admits to some numbness on and off at the first and second finger. Denies any injury, no shoulder pain per se, no fever or chills. No bladder or bowel incontinence, no gait difficulties   Review of Systems  Constitutional: No fever. No chills. No unexplained wt changes. No unusual sweats  HEENT: No dental problems, no ear discharge, no facial swelling, no voice changes. No eye discharge, no eye  redness , no  intolerance to light   Respiratory: No wheezing , no  difficulty breathing. No cough , no mucus production  Cardiovascular: No CP, no leg swelling , no  Palpitations  GI: no nausea, no vomiting, no diarrhea , no  abdominal pain.  No blood in the stools. No dysphagia, no odynophagia    Endocrine: No polyphagia, no polyuria , no polydipsia  GU: No dysuria, gross hematuria, difficulty urinating. No urinary urgency, no frequency.  Musculoskeletal: See above  Skin: No change in the color of the skin, palor , no  Rash  Allergic, immunologic: No environmental allergies , no  food allergies  Neurological: No dizziness no  syncope. No headaches. No diplopia, no slurred, no slurred speech, no motor deficits, no facial  Numbness  Hematological: No enlarged lymph nodes, no easy bruising , no unusual bleedings  Psychiatry: No suicidal ideas, no hallucinations, no beavior problems, no confusion.  No unusual/severe anxiety, no depression   Past Medical History:  Diagnosis Date  . Elevated BP   . History of stomach ulcers    EGD 2010  . Hyperlipidemia   . Infectious colitis     Past Surgical History:  Procedure Laterality Date  . NO  PAST SURGERIES      Social History   Social History  . Marital status: Married    Spouse name: N/A  . Number of children: 2  . Years of education: N/A   Occupational History  . worker, Architect Other   Social History Main Topics  . Smoking status: Never Smoker  . Smokeless tobacco: Never Used  . Alcohol use 0.0 oz/week     Comment: rare  . Drug use: No  . Sexual activity: Not on file   Other Topics Concern  . Not on file   Social History Narrative   Original from [place]   7 grade education   Household-- pt , wife, daughter 63;   Son 1998, both live w/ him     Family History  Problem Relation Age of Onset  . Gastric cancer Other     father, brother and cousin  . CAD Mother   . Stomach cancer Father   . Stomach cancer Brother   . Prostate cancer Neg Hx   . Colon cancer Neg Hx   . Diabetes Neg Hx   . Esophageal cancer Neg Hx       Medication List       Accurate as of 06/01/16  2:23 PM. Always use your most recent med list.          predniSONE 10 MG tablet Commonly known as:  DELTASONE 4 tablets x 2 days, 3 tabs x 2  days, 2 tabs x 2 days, 1 tab x 2 days          Objective:   Physical Exam BP 120/76 (BP Location: Right Arm, Patient Position: Sitting, Cuff Size: Normal)   Pulse 72   Temp 98.1 F (36.7 C) (Oral)   Resp 14   Ht 5\' 4"  (1.626 m)   Wt 182 lb 2 oz (82.6 kg)   SpO2 98%   BMI 31.26 kg/m   General:   Well developed, well nourished . NAD.  Neck: No  thyromegaly . No TTP at the cervical spine, range of motion is normal HEENT:  Normocephalic . Face symmetric, atraumatic Lungs:  CTA B Normal respiratory effort, no intercostal retractions, no accessory muscle use. Heart: RRR,  no murmur.  No pretibial edema bilaterally  Abdomen:  Not distended, soft, non-tender. No rebound or rigidity.   MSK: Shoulders symmetric, range of motion normal Skin: Exposed areas without rash. Not pale. Not jaundice Neurologic:  alert & oriented X3.    Speech normal, gait appropriate for age and unassisted Strength symmetric and appropriate for age.  DTRs and strength symmetric Psych: Cognition and judgment appear intact.  Cooperative with normal attention span and concentration.  Behavior appropriate. No anxious or depressed appearing.    Assessment & Plan:   Assessment  Dyslipidemia H/o Elevated BP Chronic epigastric pain --EGD C~2010 PUD per Pt --CT abd neg 2010 --EGD 08-2014 Dr Deatra Ina: gastric erosions (bx chronic gastritis HP neg) , Rx PPI x 3 months , then prn Gallbladder polyps per [place] 06-2014,saw GI, incidental finding >>>> [place] again 3-17, stable , next 1 year   Fatty liver per [place] 06-2014, normal LFTs  PLAN: Dyslipidemia: Diet control, check labs. Chronic epigastric pain: Not done issue at this point Gallbladder polyp: Next ultrasound 10-2016 Neck pain: Suspect C6-7 radiculopathy, neuro exam benign. Recommend treatment with prednisone, Tylenol, warm compress, discussed in Spanish.. If not improving or if the sx resurface patient to let me know. RTC one year.

## 2016-06-01 NOTE — Progress Notes (Signed)
Pre visit review using our clinic review tool, if applicable. No additional management support is needed unless otherwise documented below in the visit note. 

## 2016-06-02 LAB — LIPID PANEL
Cholesterol: 204 mg/dL — ABNORMAL HIGH (ref 0–200)
HDL: 33.4 mg/dL — AB (ref >39.00)
Total CHOL/HDL Ratio: 6

## 2016-06-02 LAB — COMPREHENSIVE METABOLIC PANEL
ALBUMIN: 4.1 g/dL (ref 3.5–5.2)
ALK PHOS: 62 U/L (ref 39–117)
ALT: 22 U/L (ref 0–53)
AST: 19 U/L (ref 0–37)
BILIRUBIN TOTAL: 0.4 mg/dL (ref 0.2–1.2)
BUN: 14 mg/dL (ref 6–23)
CALCIUM: 9.2 mg/dL (ref 8.4–10.5)
CO2: 29 mEq/L (ref 19–32)
CREATININE: 0.99 mg/dL (ref 0.40–1.50)
Chloride: 102 mEq/L (ref 96–112)
GFR: 86.5 mL/min (ref >60.00)
Glucose, Bld: 103 mg/dL — ABNORMAL HIGH (ref 70–99)
Potassium: 3.9 mEq/L (ref 3.5–5.1)
Sodium: 139 mEq/L (ref 135–145)
TOTAL PROTEIN: 7.2 g/dL (ref 6.0–8.3)

## 2016-06-02 LAB — CBC WITH DIFFERENTIAL/PLATELET
BASOS ABS: 0 10*3/uL (ref 0.0–0.1)
BASOS PCT: 0.3 % (ref 0.0–3.0)
EOS ABS: 0.2 10*3/uL (ref 0.0–0.7)
Eosinophils Relative: 2.2 % (ref 0.0–5.0)
HEMATOCRIT: 44 % (ref 39.0–52.0)
Hemoglobin: 14.9 g/dL (ref 13.0–17.0)
LYMPHS ABS: 2.1 10*3/uL (ref 0.7–4.0)
Lymphocytes Relative: 26.8 % (ref 12.0–46.0)
MCHC: 33.9 g/dL (ref 30.0–36.0)
MCV: 83 fl (ref 78.0–100.0)
Monocytes Absolute: 0.5 10*3/uL (ref 0.1–1.0)
Monocytes Relative: 6 % (ref 3.0–12.0)
NEUTROS ABS: 5.1 10*3/uL (ref 1.4–7.7)
NEUTROS PCT: 64.7 % (ref 43.0–77.0)
PLATELETS: 201 10*3/uL (ref 150.0–400.0)
RBC: 5.29 Mil/uL (ref 4.22–5.81)
RDW: 13.9 % (ref 11.5–15.5)
WBC: 7.9 10*3/uL (ref 4.0–10.5)

## 2016-06-02 LAB — LDL CHOLESTEROL, DIRECT: LDL DIRECT: 112 mg/dL

## 2016-06-02 LAB — HEMOGLOBIN A1C: Hgb A1c MFr Bld: 5.8 % (ref 4.6–6.5)

## 2016-06-02 NOTE — Assessment & Plan Note (Signed)
Dyslipidemia: Diet control, check labs. Chronic epigastric pain: Not done issue at this point Gallbladder polyp: Next ultrasound 10-2016 Neck pain: Suspect C6-7 radiculopathy, neuro exam benign. Recommend treatment with prednisone, Tylenol, warm compress, discussed in Spanish.. If not improving or if the sx resurface patient to let me know. RTC one year.

## 2016-06-03 ENCOUNTER — Encounter: Payer: Self-pay | Admitting: Internal Medicine

## 2016-06-04 ENCOUNTER — Telehealth: Payer: Self-pay | Admitting: Internal Medicine

## 2016-06-04 ENCOUNTER — Other Ambulatory Visit: Payer: Self-pay

## 2016-06-04 DIAGNOSIS — E785 Hyperlipidemia, unspecified: Secondary | ICD-10-CM

## 2016-06-04 NOTE — Telephone Encounter (Signed)
Caller name: [employer]  Relation to pt: self  Call back number: [phone] Pharmacy:  Reason for call: Pt was returning call that he received today (06-04-16) but could not answer at the moment ( for his labs results) but would like to have provider to call him back. Please advise.

## 2016-06-07 NOTE — Telephone Encounter (Signed)
Called, no answer.

## 2016-06-11 NOTE — Telephone Encounter (Signed)
See lab results. Letter sent

## 2016-12-01 ENCOUNTER — Telehealth: Payer: Self-pay | Admitting: Internal Medicine

## 2016-12-01 DIAGNOSIS — K824 Cholesterolosis of gallbladder: Secondary | ICD-10-CM

## 2016-12-01 NOTE — Telephone Encounter (Signed)
Please enter a RUQ ultrasound of the abdomen, DX, GB polyps Let the patient know

## 2016-12-02 NOTE — Telephone Encounter (Signed)
Order placed

## 2016-12-13 ENCOUNTER — Telehealth: Payer: Self-pay | Admitting: Internal Medicine

## 2016-12-13 NOTE — Telephone Encounter (Signed)
Evan Freeman - can you please call pt to see if he can come in for ultrasound of abdomen? He is due for a repeat scan due to polyps in his gallbladder. Thank you.     msg from [facility],   We have called the above patient three times and have left voice mails, the patient has yet to give [place] a call back. May you please have patient contact us so we may schedule his [place].   Thank you,  Destiny - [facility]

## 2016-12-14 NOTE — Telephone Encounter (Signed)
LVM in spanish for pt to return call so we can inform him the info mentioned below.

## 2017-06-02 ENCOUNTER — Encounter: Payer: Self-pay | Admitting: Internal Medicine

## 2017-06-29 ENCOUNTER — Ambulatory Visit (INDEPENDENT_AMBULATORY_CARE_PROVIDER_SITE_OTHER): Payer: Self-pay | Admitting: Internal Medicine

## 2017-06-29 ENCOUNTER — Encounter: Payer: Self-pay | Admitting: Internal Medicine

## 2017-06-29 VITALS — BP 126/72 | HR 62 | Temp 98.4°F | Resp 14 | Ht 64.0 in | Wt 187.5 lb

## 2017-06-29 DIAGNOSIS — Z0000 Encounter for general adult medical examination without abnormal findings: Secondary | ICD-10-CM

## 2017-06-29 LAB — LIPID PANEL
CHOLESTEROL: 206 mg/dL — AB (ref 0–200)
HDL: 39.3 mg/dL (ref >39.00)
LDL CALC: 136 mg/dL — AB (ref 0–99)
NonHDL: 166.44
Total CHOL/HDL Ratio: 5
Triglycerides: 153 mg/dL — ABNORMAL HIGH (ref 0.0–149.0)
VLDL: 30.6 mg/dL (ref 0.0–40.0)

## 2017-06-29 LAB — COMPREHENSIVE METABOLIC PANEL
ALBUMIN: 4.3 g/dL (ref 3.5–5.2)
ALK PHOS: 53 U/L (ref 39–117)
ALT: 34 U/L (ref 0–53)
AST: 25 U/L (ref 0–37)
BUN: 13 mg/dL (ref 6–23)
CHLORIDE: 103 meq/L (ref 96–112)
CO2: 30 mEq/L (ref 19–32)
Calcium: 9.2 mg/dL (ref 8.4–10.5)
Creatinine, Ser: 0.81 mg/dL (ref 0.40–1.50)
GFR: 108.53 mL/min (ref >60.00)
Glucose, Bld: 103 mg/dL — ABNORMAL HIGH (ref 70–99)
POTASSIUM: 3.9 meq/L (ref 3.5–5.1)
SODIUM: 139 meq/L (ref 135–145)
Total Bilirubin: 0.8 mg/dL (ref 0.2–1.2)
Total Protein: 7.4 g/dL (ref 6.0–8.3)

## 2017-06-29 LAB — CBC WITH DIFFERENTIAL/PLATELET
BASOS PCT: 0.4 % (ref 0.0–3.0)
Basophils Absolute: 0 10*3/uL (ref 0.0–0.1)
EOS ABS: 0.2 10*3/uL (ref 0.0–0.7)
EOS PCT: 2.1 % (ref 0.0–5.0)
HCT: 47.1 % (ref 39.0–52.0)
HEMOGLOBIN: 15.2 g/dL (ref 13.0–17.0)
LYMPHS ABS: 2.8 10*3/uL (ref 0.7–4.0)
Lymphocytes Relative: 35 % (ref 12.0–46.0)
MCHC: 32.3 g/dL (ref 30.0–36.0)
MCV: 86.4 fl (ref 78.0–100.0)
MONO ABS: 0.4 10*3/uL (ref 0.1–1.0)
Monocytes Relative: 5.2 % (ref 3.0–12.0)
NEUTROS ABS: 4.7 10*3/uL (ref 1.4–7.7)
NEUTROS PCT: 57.3 % (ref 43.0–77.0)
PLATELETS: 197 10*3/uL (ref 150.0–400.0)
RBC: 5.45 Mil/uL (ref 4.22–5.81)
RDW: 14 % (ref 11.5–15.5)
WBC: 8.1 10*3/uL (ref 4.0–10.5)

## 2017-06-29 LAB — TSH: TSH: 0.96 u[IU]/mL (ref 0.35–4.50)

## 2017-06-29 LAB — HEMOGLOBIN A1C: HEMOGLOBIN A1C: 5.9 % (ref 4.6–6.5)

## 2017-06-29 NOTE — Assessment & Plan Note (Addendum)
-  Td 2013, declined a flu shot -Colon cancer screening: No FH, started at age 46 -Prostate cancer screening: no FH, started at age 64 -Diet and exercise discussed -Labs: CMP, FLP, CBC, A1c, TSH

## 2017-06-29 NOTE — Progress Notes (Signed)
Pre visit review using our clinic review tool, if applicable. No additional management support is needed unless otherwise documented below in the visit note. 

## 2017-06-29 NOTE — Patient Instructions (Signed)
GO TO THE LAB : Get the blood work     GO TO THE FRONT DESK Schedule your next appointment for a physical exam in 1 year  HYDROCORTISONE 1% CREAM  : PONGASELA EN LOS BRAZOS 2 VECES AL DIA POR 10 DIAS  LE VAMOS A HACER [place]

## 2017-06-29 NOTE — Progress Notes (Signed)
Subjective:    Patient ID: Evan Freeman, male    DOB: [birth year], 46 y.o.   MRN: [id number]  DOS:  06/29/2017 Type of visit - description : cpx Interval history:  No major concerns  Review of Systems From time to time has a "bitter taste" in his mouth. When asked, admits to occasional halitosis acid reflux. Also month ago noted some spots in both arms.  Initially they itch but not anymore.   Other than above, a 14 point review of systems is negative    Past Medical History:  Diagnosis Date  . Abdominal pain, chronic, epigastric 06/14/2014  . Elevated BP   . Gallbladder polyp 08/05/2014  . History of stomach ulcers    EGD 2010  . Hyperlipidemia   . Infectious colitis     Past Surgical History:  Procedure Laterality Date  . NO PAST SURGERIES      Social History   Social History  . Marital status: Married    Spouse name: N/A  . Number of children: 2  . Years of education: N/A   Occupational History  . worker, Architect Other   Social History Main Topics  . Smoking status: Never Smoker  . Smokeless tobacco: Never Used  . Alcohol use 0.0 oz/week     Comment: rare  . Drug use: No  . Sexual activity: Not on file   Other Topics Concern  . Not on file   Social History Narrative   Original from [place]   7 grade education   Household-- pt, wife, daughter 61; son 1998, both live w/ him     Family History  Problem Relation Age of Onset  . Gastric cancer Other        father, brother and cousin  . CAD Mother   . Stomach cancer Father   . Stomach cancer Brother   . Prostate cancer Neg Hx   . Colon cancer Neg Hx   . Diabetes Neg Hx   . Esophageal cancer Neg Hx      Allergies as of 06/29/2017   No Known Allergies     Medication List    as of 06/29/2017 11:59 PM   You have not been prescribed any medications.        Objective:   Physical Exam BP 126/72 (BP Location: Left Arm, Patient Position: Sitting, Cuff Size: Small)   Pulse  62   Temp 98.4 F (36.9 C) (Oral)   Resp 14   Ht 5\' 4"  (1.626 m)   Wt 187 lb 8 oz (85 kg)   SpO2 98%   BMI 32.18 kg/m    General:   Well developed, well nourished . NAD.  Neck: No  thyromegaly  HEENT:  Normocephalic . Face symmetric, atraumatic Lungs:  CTA B Normal respiratory effort, no intercostal retractions, no accessory muscle use. Heart: RRR,  no murmur.  No pretibial edema bilaterally  Abdomen:  Not distended, soft, non-tender. No rebound or rigidity.   Skin: Upper extremities with few macular, hypopigmented, mostly oval lesions, ~ 1 cm.  No scaly. Neurologic:  alert & oriented X3.  Speech normal, gait appropriate for age and unassisted Strength symmetric and appropriate for age.  Psych: Cognition and judgment appear intact.  Cooperative with normal attention span and concentration.  Behavior appropriate. No anxious or depressed appearing.\    Assessment & Plan:   Assessment  Dyslipidemia H/o Elevated BP Chronic epigastric pain --EGD C~2010 PUD per Pt --CT abd neg 2010 --EGD 08-2014 Dr Deatra Ina:  gastric erosions (bx chronic gastritis HP neg) , Rx PPI x 3 months , then prn Gallbladder polyps per [place] 06-2014,saw GI, incidental finding >>>> [place] again 3-17, stable , next 1 year   Fatty liver per [place] 06-2014, normal LFTs  PLAN: Dyslipidemia: Diet controlled Gallbladder polyp: Last ultrasound 10-2015, need to continue monitoring the area , risk of cancer d/w pt; he asked me not to schedule a ultrasound just yet, he does not live here, does not like to establish w/ other doctor out of state; he plans to call 2- 3 days before his next visit to [facility] so I will then schedule the ultrasound. Pityriasis  Alba?  Recommend OTC hydrocortisone 1% cream.  Call if not better GERD: bitter taste in the mouth, rx OTC prilosec x 6 weeks RTC 1 year

## 2017-06-30 NOTE — Assessment & Plan Note (Signed)
Dyslipidemia: Diet controlled Gallbladder polyp: Last ultrasound 10-2015, need to continue monitoring the area , risk of cancer d/w pt; he asked me not to schedule a ultrasound just yet, he does not live here, does not like to establish w/ other doctor out of state; he plans to call 2- 3 days before his next visit to [facility] so I will then schedule the ultrasound. Pityriasis  Alba?  Recommend OTC hydrocortisone 1% cream.  Call if not better GERD: bitter taste in the mouth, rx OTC prilosec x 6 weeks RTC 1 year

## 2017-08-26 ENCOUNTER — Telehealth: Payer: Self-pay | Admitting: Internal Medicine

## 2017-08-26 DIAGNOSIS — K824 Cholesterolosis of gallbladder: Secondary | ICD-10-CM

## 2017-08-26 NOTE — Telephone Encounter (Signed)
RUQ abdominal ultrasound order placed.

## 2017-08-26 NOTE — Telephone Encounter (Signed)
Please enter order for a gallbladder ultrasound (Limited RUQ ultrasound) DX gallbladder polyps.

## 2017-08-26 NOTE — Telephone Encounter (Signed)
Please advise 

## 2017-08-26 NOTE — Telephone Encounter (Signed)
Copied from [employer] [place] [employer] [id number]. Topic: Quick Communication - See Telephone Encounter >> Aug 26, 2017 11:00 AM Rosalin Hawking wrote: CRM for notification. See Telephone encounter for:  08/26/17.   Pt came in office stating that provider mentioned to pt during his last visit to let him know when he is ready to have a referral sent to have ultra sound done (pt would like to have it set up here from down stairs ). Please advise.

## 2017-09-01 ENCOUNTER — Ambulatory Visit (HOSPITAL_BASED_OUTPATIENT_CLINIC_OR_DEPARTMENT_OTHER)
Admission: RE | Admit: 2017-09-01 | Discharge: 2017-09-01 | Disposition: A | Discharge status: Home / Self Care | Payer: Self-pay | Source: Ambulatory Visit | Attending: Internal Medicine | Admitting: Internal Medicine

## 2017-09-01 DIAGNOSIS — K824 Cholesterolosis of gallbladder: Secondary | ICD-10-CM | POA: Insufficient documentation

## 2018-07-03 ENCOUNTER — Encounter: Payer: Self-pay | Admitting: Internal Medicine

## 2018-07-06 ENCOUNTER — Encounter: Payer: Self-pay | Admitting: Internal Medicine

## 2018-10-24 IMAGING — US US ABDOMEN LIMITED
1 series · 14 of 25 positions shown · non-contrast
Comparison: 11/28/2015

CLINICAL DATA: Gallbladder polyp, followup

EXAM:
ULTRASOUND ABDOMEN LIMITED RIGHT UPPER QUADRANT

[Series 1: us abdomen limited · 0.18mm/px · 14 of 79 slices shown]
[im 1/79]
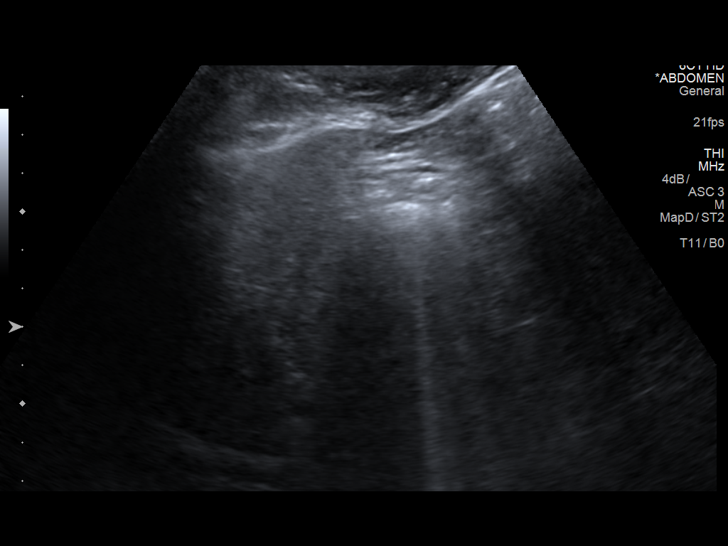
[im 7/79]
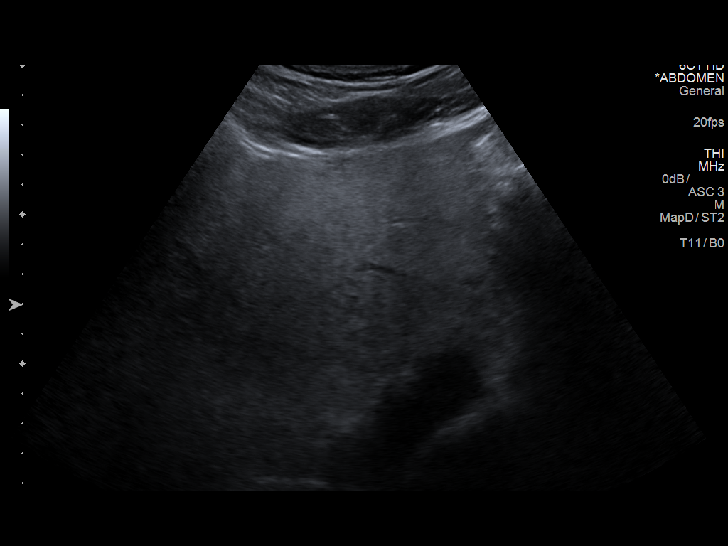
[im 14/79]
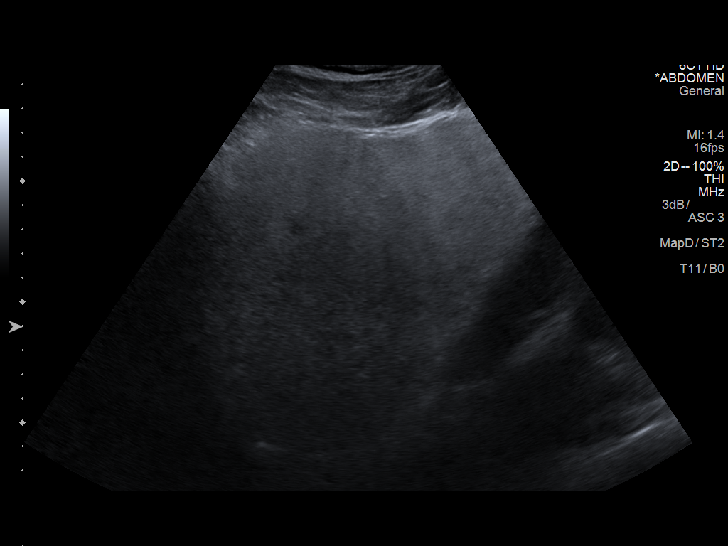
[im 20/79]
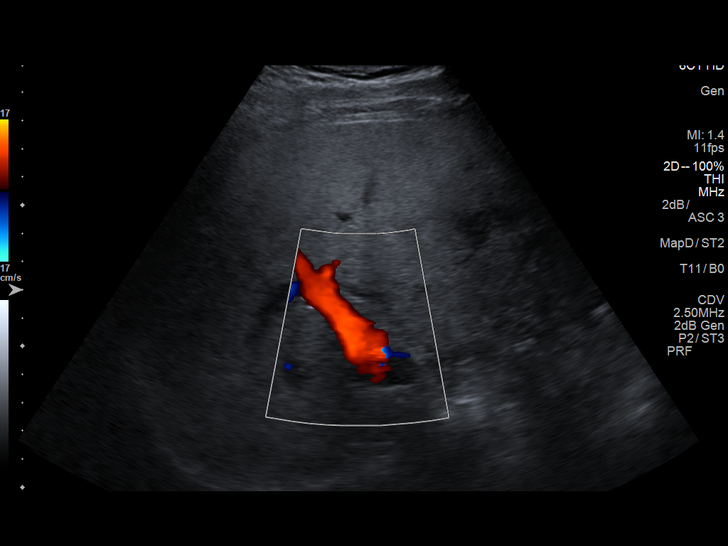
[im 27/79]
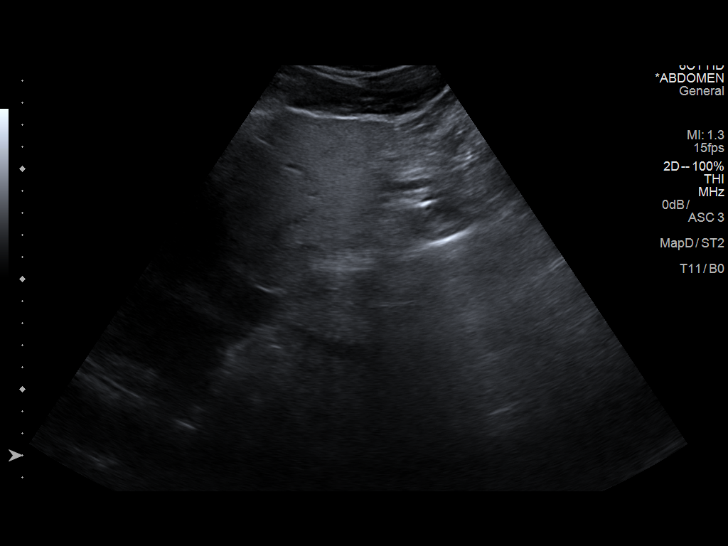
[im 30/79]
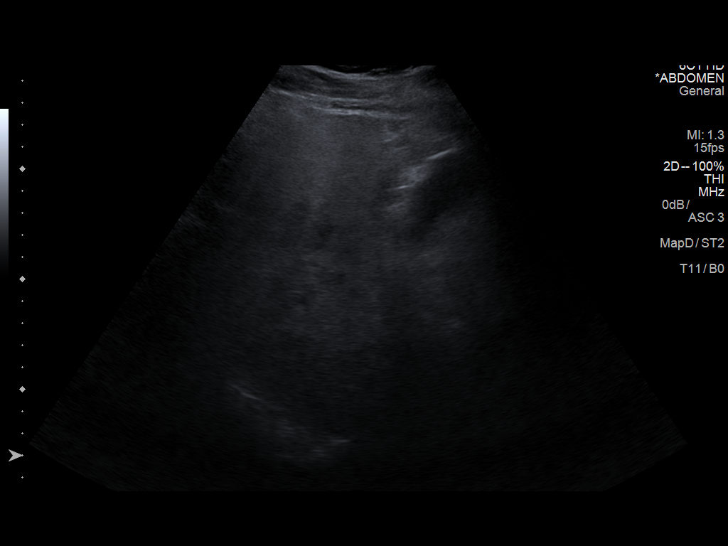
[im 36/79]
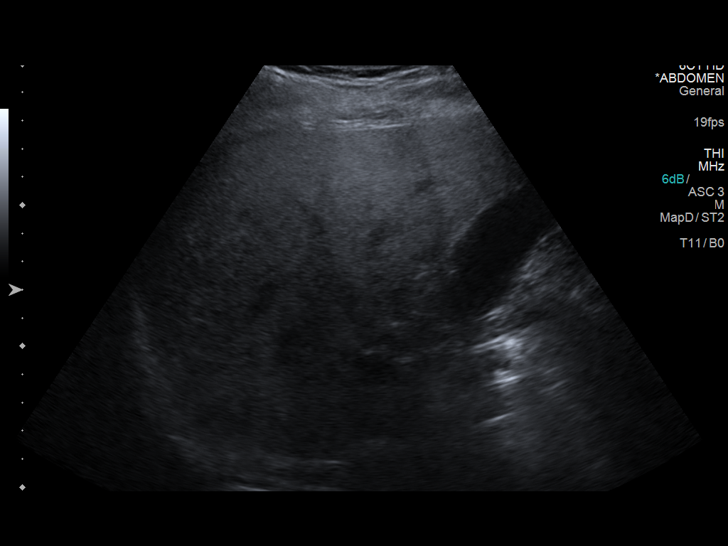
[im 43/79]
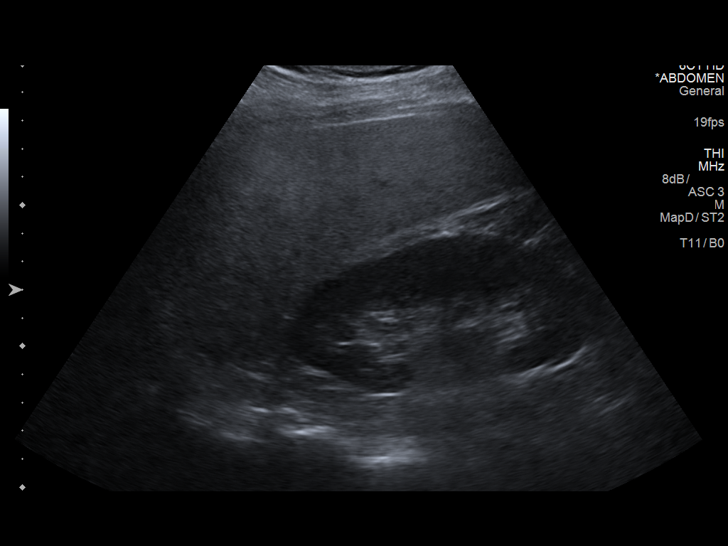
[im 49/79]
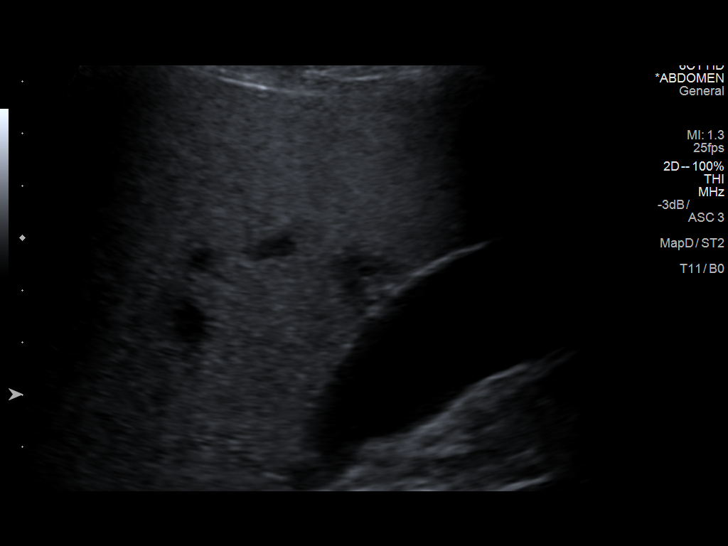
[im 53/79]
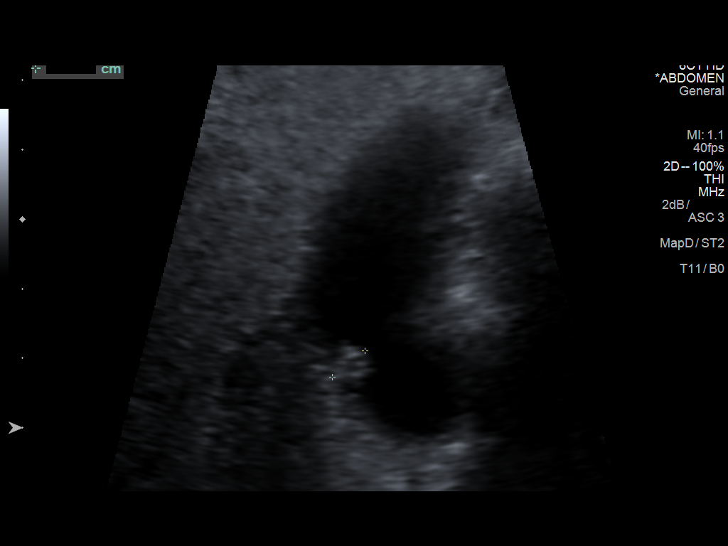
[im 59/79]
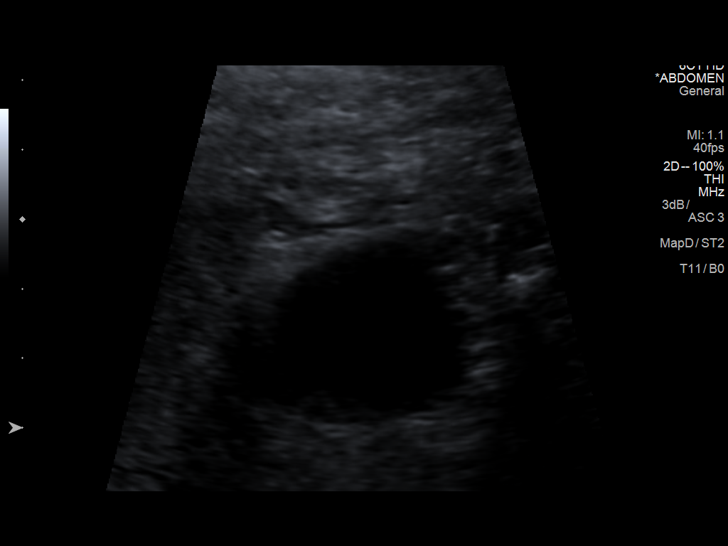
[im 66/79]
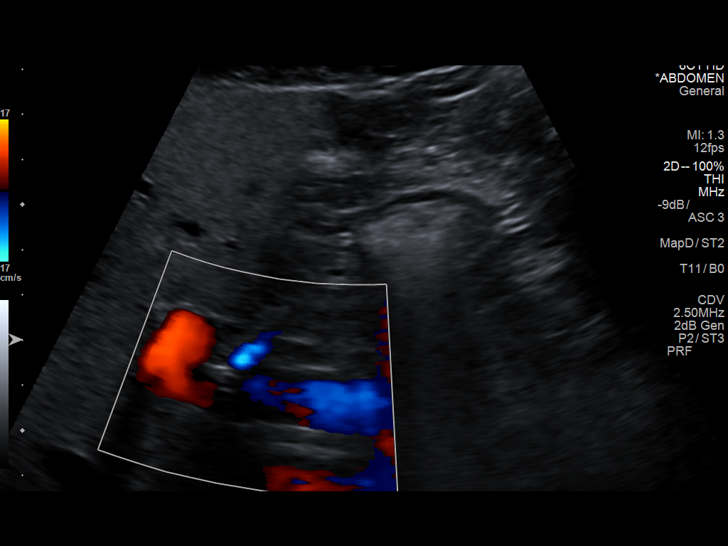
[im 72/79]
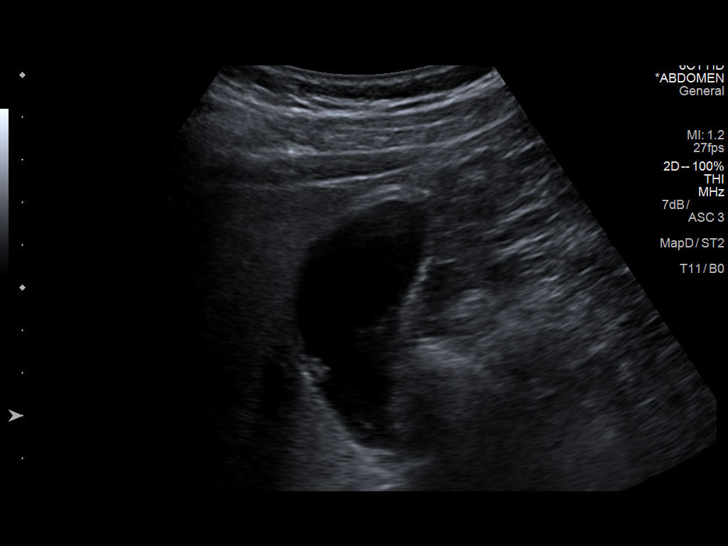
[im 79/79]
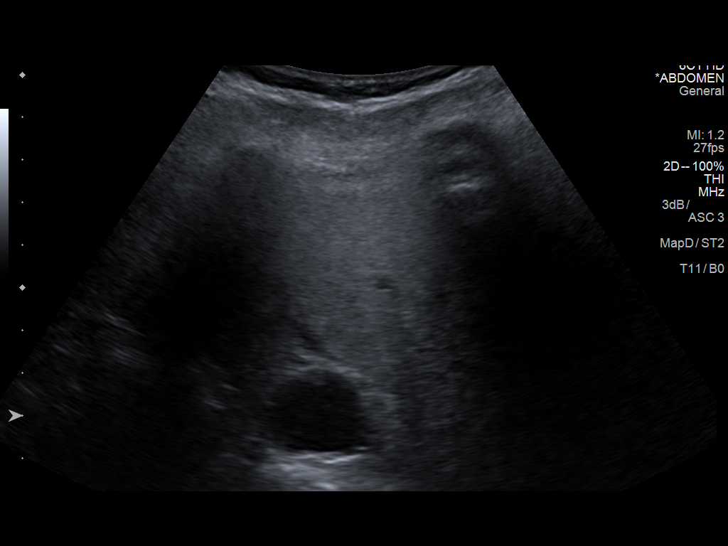

[14 of 25 positions shown; findings below may reference images not displayed]

FINDINGS: Gallbladder:

Gallbladder polyp identified measuring 7 mm in greatest size. No
gallbladder wall thickening, pericholecystic fluid or sonographic
Murphy sign.

Common bile duct:

Diameter: 4 mm diameter , normal

Liver:

Echogenic parenchyma, likely fatty infiltration though this can be
seen with cirrhosis and certain infiltrative disorders. Probable
focal sparing adjacent to gallbladder fossa. No additional mass
lesion. Portal vein is patent on color Doppler imaging with normal
direction of blood flow towards the liver.

No RIGHT upper quadrant free fluid
IMPRESSION: Stable gallbladder polyp 7 mm diameter.

Probable fatty infiltration of liver with focal sparing adjacent to
gallbladder fossa as above.

## 2018-12-26 ENCOUNTER — Telehealth: Payer: Self-pay

## 2018-12-26 NOTE — Telephone Encounter (Signed)
Called pt and pt does not have VM set up and could not leave message, pt is needing VOV fu appt since pt last seen was in 2018.

## 2018-12-26 NOTE — Telephone Encounter (Signed)
Last ov 2018[name]- can you offer virtual visit? Thank you.

## 2018-12-26 NOTE — Telephone Encounter (Signed)
Virtual visit scheduled 01/02/2019.

## 2019-01-02 ENCOUNTER — Other Ambulatory Visit: Payer: Self-pay

## 2019-01-02 ENCOUNTER — Encounter: Payer: Self-pay | Admitting: Internal Medicine

## 2019-01-02 ENCOUNTER — Ambulatory Visit (INDEPENDENT_AMBULATORY_CARE_PROVIDER_SITE_OTHER): Payer: Self-pay | Admitting: Internal Medicine

## 2019-01-02 DIAGNOSIS — Z0000 Encounter for general adult medical examination without abnormal findings: Secondary | ICD-10-CM

## 2019-01-02 NOTE — Progress Notes (Signed)
Subjective:    Patient ID: Evan Freeman, male    DOB: Oct 13, 1970, 48 y.o.   MRN: [id number]  DOS:  01/02/2019 Type of visit - description: Virtual Visit via Video Note  I connected with@ on 01/02/19 at  2:20 PM EDT by a video enabled telemedicine application and verified that I am speaking with the correct person using two identifiers.   THIS ENCOUNTER IS A VIRTUAL VISIT DUE TO COVID-19 - PATIENT WAS NOT SEEN IN THE OFFICE. PATIENT HAS CONSENTED TO VIRTUAL VISIT / TELEMEDICINE VISIT   Location of patient: home  Location of provider: office  I discussed the limitations of evaluation and management by telemedicine and the availability of in person appointments. The patient expressed understanding and agreed to proceed.  History of Present Illness: CPX, last office visit 05-2017 No major events since then, feeling well, currently not working due to COVID-19. He misses work, feels better and more energetic when he is active. Currently taking no medication, no recent ambulatory BPs  Review of Systems Specifically he denies fever, chills, blood in the stools, LUTS.  No cough A 14 point review of systems is negative    Past Medical History:  Diagnosis Date  . Abdominal pain, chronic, epigastric 06/14/2014  . Elevated BP   . Gallbladder polyp 08/05/2014  . History of stomach ulcers    EGD 2010  . Hyperlipidemia   . Infectious colitis     Past Surgical History:  Procedure Laterality Date  . NO PAST SURGERIES      Social History   Socioeconomic History  . Marital status: Married    Spouse name: Not on file  . Number of children: 2  . Years of education: Not on file  . Highest education level: Not on file  Occupational History  . Occupation: Insurance underwriter, Agricultural consultant: [employer]  . Financial resource strain: Not on file  . Food insecurity:    Worry: Not on file    Inability: Not on file  . Transportation needs:    Medical: Not on file   Non-medical: Not on file  Tobacco Use  . Smoking status: Never Smoker  . Smokeless tobacco: Never Used  Substance and Sexual Activity  . Alcohol use: Yes    Alcohol/week: 0.0 standard drinks    Comment: rare  . Drug use: No  . Sexual activity: Not on file  Lifestyle  . Physical activity:    Days per week: Not on file    Minutes per session: Not on file  . Stress: Not on file  Relationships  . Social connections:    Talks on phone: Not on file    Gets together: Not on file    Attends religious service: Not on file    Active member of club or organization: Not on file    Attends meetings of clubs or organizations: Not on file    Relationship status: Not on file  . Intimate partner violence:    Fear of current or ex partner: Not on file    Emotionally abused: Not on file    Physically abused: Not on file    Forced sexual activity: Not on file  Other Topics Concern  . Not on file  Social History Narrative   Original from [place]   7 grade education   Household-- pt, wife, daughter 42; son 1998, both live w/ him     Family History  Problem Relation Age of Onset  . Gastric  cancer Other        father, brother and cousin  . CAD Mother   . Hypertension Mother   . Stomach cancer Father   . Stomach cancer Brother   . Prostate cancer Neg Hx   . Colon cancer Neg Hx   . Diabetes Neg Hx   . Esophageal cancer Neg Hx      Allergies as of 01/02/2019   No Known Allergies     Medication List    as of Jan 02, 2019  2:16 PM   You have not been prescribed any medications.         Objective:   Physical Exam There were no vitals taken for this visit. This is a virtual video visit, alert oriented x3, no apparent distress    Assessment     Assessment  Dyslipidemia H/o Elevated BP Chronic epigastric pain --EGD C~2010 PUD per Pt --CT abd neg 2010 --EGD 08-2014 Dr Deatra Ina: gastric erosions (bx chronic gastritis HP neg) , Rx PPI x 3 months , then prn Gallbladder polyps  per [place] 06-2014,saw GI, incidental finding >>>> [place] again 3-17, stable , next 1 year   Fatty liver per [place] 06-2014, normal LFTs  PLAN: Dyslipidemia: Diet controlled, check labs Elevated BP: No recent ambulatory BPs, when he comes back for labs, will check his BP. Gallbladder polyp: Recommend to proceed with a ultrasound, risk of malignancy (cancer) discussed with the patient, last ultrasound was 10-2015.  At this point he declines due to cost. Follow-up: Labs this week, CPX in 1 year   I discussed the assessment and treatment plan with the patient. The patient was provided an opportunity to ask questions and all were answered. The patient agreed with the plan and demonstrated an understanding of the instructions.   The patient was advised to call back or seek an in-person evaluation if the symptoms worsen or if the condition fails to improve as anticipated.

## 2019-01-02 NOTE — Assessment & Plan Note (Signed)
-  Td 2013, rec flu shot every year -Colon cancer screening: No FH, started at age 48 -Prostate cancer screening: no FH, started at age 18 -Diet and exercise discussed -Labs: CMP, FLP, CBC, A1c

## 2019-01-03 NOTE — Assessment & Plan Note (Signed)
Dyslipidemia: Diet controlled, check labs Elevated BP: No recent ambulatory BPs, when he comes back for labs, will check his BP. Gallbladder polyp: Recommend to proceed with a ultrasound, risk of malignancy (cancer) discussed with the patient, last ultrasound was 10-2015.  At this point he declines due to cost. Follow-up: Labs this week, CPX in 1 year

## 2019-01-05 ENCOUNTER — Ambulatory Visit (INDEPENDENT_AMBULATORY_CARE_PROVIDER_SITE_OTHER): Payer: Self-pay | Admitting: Internal Medicine

## 2019-01-05 ENCOUNTER — Other Ambulatory Visit: Payer: Self-pay

## 2019-01-05 ENCOUNTER — Other Ambulatory Visit (INDEPENDENT_AMBULATORY_CARE_PROVIDER_SITE_OTHER): Payer: Self-pay

## 2019-01-05 DIAGNOSIS — Z0000 Encounter for general adult medical examination without abnormal findings: Secondary | ICD-10-CM

## 2019-01-05 DIAGNOSIS — I10 Essential (primary) hypertension: Secondary | ICD-10-CM

## 2019-01-05 LAB — LIPID PANEL
Cholesterol: 221 mg/dL — ABNORMAL HIGH (ref 0–200)
HDL: 37.4 mg/dL — ABNORMAL LOW (ref >39.00)
NonHDL: 183.54
Total CHOL/HDL Ratio: 6
Triglycerides: 232 mg/dL — ABNORMAL HIGH (ref 0.0–149.0)
VLDL: 46.4 mg/dL — ABNORMAL HIGH (ref 0.0–40.0)

## 2019-01-05 LAB — COMPREHENSIVE METABOLIC PANEL
ALT: 27 U/L (ref 0–53)
AST: 18 U/L (ref 0–37)
Albumin: 4.4 g/dL (ref 3.5–5.2)
Alkaline Phosphatase: 59 U/L (ref 39–117)
BUN: 16 mg/dL (ref 6–23)
CO2: 29 mEq/L (ref 19–32)
Calcium: 8.9 mg/dL (ref 8.4–10.5)
Chloride: 102 mEq/L (ref 96–112)
Creatinine, Ser: 0.94 mg/dL (ref 0.40–1.50)
GFR: 85.44 mL/min (ref >60.00)
Glucose, Bld: 92 mg/dL (ref 70–99)
Potassium: 3.9 mEq/L (ref 3.5–5.1)
Sodium: 139 mEq/L (ref 135–145)
Total Bilirubin: 0.7 mg/dL (ref 0.2–1.2)
Total Protein: 7 g/dL (ref 6.0–8.3)

## 2019-01-05 LAB — CBC WITH DIFFERENTIAL/PLATELET
Basophils Absolute: 0 10*3/uL (ref 0.0–0.1)
Basophils Relative: 0.3 % (ref 0.0–3.0)
Eosinophils Absolute: 0.2 10*3/uL (ref 0.0–0.7)
Eosinophils Relative: 3 % (ref 0.0–5.0)
HCT: 44.8 % (ref 39.0–52.0)
Hemoglobin: 15 g/dL (ref 13.0–17.0)
Lymphocytes Relative: 41.4 % (ref 12.0–46.0)
Lymphs Abs: 3.1 10*3/uL (ref 0.7–4.0)
MCHC: 33.5 g/dL (ref 30.0–36.0)
MCV: 84.1 fl (ref 78.0–100.0)
Monocytes Absolute: 0.5 10*3/uL (ref 0.1–1.0)
Monocytes Relative: 6.5 % (ref 3.0–12.0)
Neutro Abs: 3.7 10*3/uL (ref 1.4–7.7)
Neutrophils Relative %: 48.8 % (ref 43.0–77.0)
Platelets: 197 10*3/uL (ref 150.0–400.0)
RBC: 5.32 Mil/uL (ref 4.22–5.81)
RDW: 14.4 % (ref 11.5–15.5)
WBC: 7.6 10*3/uL (ref 4.0–10.5)

## 2019-01-05 LAB — LDL CHOLESTEROL, DIRECT: Direct LDL: 121 mg/dL

## 2019-01-05 LAB — HEMOGLOBIN A1C: Hgb A1c MFr Bld: 5.9 % (ref 4.6–6.5)

## 2019-01-05 MED ORDER — AMLODIPINE BESYLATE 5 MG PO TABS: 5.0000 mg | ORAL_TABLET | Freq: Every day | ORAL | 3 refills | Status: DC

## 2019-01-05 NOTE — Progress Notes (Signed)
Patient here to get labs and check his blood pressure. BP today: 165/102 left arm.  Repeated BP 145/105. Plan: Labs as recommended Start amlodipine 5 mg 1 tablet daily, send a prescription #30, 2 refills Schedule face-to-face office visit with me 6 weeks from today. Advised about low-salt diet Watch for side effects specifically lower extremity edema JP

## 2019-01-05 NOTE — Progress Notes (Signed)
Pre visit review using our clinic tool,if applicable. No additional management support is needed unless otherwise documented below in the visit note.   Pt here for Blood pressure check per order from Dr. Kathlene November MD.  Pt currently takes: No BP medications.  BP today  = 154/105 HR = 61  Pt advised per Dr. Larose Kells to start Amlodipine 5 mg daily. Monitor salt intake. Schedule face to face appointment with Dr. Larose Kells for 6 weeks. Patient had information interpreted and agrees to orders.

## 2019-01-29 LAB — NOVEL CORONAVIRUS, NAA: SARS-CoV-2, NAA: POSITIVE

## 2019-02-12 ENCOUNTER — Inpatient Hospital Stay (HOSPITAL_COMMUNITY)
Admission: EM | Admit: 2019-02-12 | Discharge: 2019-02-17 | DRG: 177 | Disposition: A | Discharge status: Home / Self Care | Payer: HRSA Program | Attending: Family Medicine | Admitting: Family Medicine

## 2019-02-12 ENCOUNTER — Other Ambulatory Visit: Payer: Self-pay

## 2019-02-12 ENCOUNTER — Encounter (HOSPITAL_COMMUNITY): Payer: Self-pay

## 2019-02-12 ENCOUNTER — Emergency Department (HOSPITAL_COMMUNITY): Payer: HRSA Program

## 2019-02-12 DIAGNOSIS — E785 Hyperlipidemia, unspecified: Secondary | ICD-10-CM | POA: Diagnosis present

## 2019-02-12 DIAGNOSIS — J9601 Acute respiratory failure with hypoxia: Secondary | ICD-10-CM | POA: Diagnosis not present

## 2019-02-12 DIAGNOSIS — I10 Essential (primary) hypertension: Secondary | ICD-10-CM | POA: Diagnosis not present

## 2019-02-12 DIAGNOSIS — U071 COVID-19: Secondary | ICD-10-CM | POA: Diagnosis present

## 2019-02-12 DIAGNOSIS — J1289 Other viral pneumonia: Secondary | ICD-10-CM | POA: Diagnosis present

## 2019-02-12 DIAGNOSIS — Z79899 Other long term (current) drug therapy: Secondary | ICD-10-CM

## 2019-02-12 LAB — CBC WITH DIFFERENTIAL/PLATELET
Abs Immature Granulocytes: 0 10*3/uL (ref 0.00–0.07)
Basophils Absolute: 0 10*3/uL (ref 0.0–0.1)
Basophils Relative: 0 %
Eosinophils Absolute: 0.1 10*3/uL (ref 0.0–0.5)
Eosinophils Relative: 2 %
HCT: 41.1 % (ref 39.0–52.0)
Hemoglobin: 13.5 g/dL (ref 13.0–17.0)
Lymphocytes Relative: 10 %
Lymphs Abs: 0.7 10*3/uL (ref 0.7–4.0)
MCH: 27.5 pg (ref 26.0–34.0)
MCHC: 32.8 g/dL (ref 30.0–36.0)
MCV: 83.7 fL (ref 80.0–100.0)
Monocytes Absolute: 0.1 10*3/uL (ref 0.1–1.0)
Monocytes Relative: 2 %
Neutro Abs: 5.7 10*3/uL (ref 1.7–7.7)
Neutrophils Relative %: 86 %
Platelets: 315 10*3/uL (ref 150–400)
RBC: 4.91 MIL/uL (ref 4.22–5.81)
RDW: 13.2 % (ref 11.5–15.5)
WBC: 6.6 10*3/uL (ref 4.0–10.5)
nRBC: 0 % (ref 0.0–0.2)
nRBC: 0 /100 WBC

## 2019-02-12 LAB — COMPREHENSIVE METABOLIC PANEL
ALT: 42 U/L (ref 0–44)
AST: 50 U/L — ABNORMAL HIGH (ref 15–41)
Albumin: 2.5 g/dL — ABNORMAL LOW (ref 3.5–5.0)
Alkaline Phosphatase: 64 U/L (ref 38–126)
Anion gap: 11 (ref 5–15)
BUN: 12 mg/dL (ref 6–20)
CO2: 24 mmol/L (ref 22–32)
Calcium: 8.2 mg/dL — ABNORMAL LOW (ref 8.9–10.3)
Chloride: 100 mmol/L (ref 98–111)
Creatinine, Ser: 0.92 mg/dL (ref 0.61–1.24)
GFR calc Af Amer: >60 mL/min (ref >60)
GFR calc non Af Amer: >60 mL/min (ref >60)
Glucose, Bld: 135 mg/dL — ABNORMAL HIGH (ref 70–99)
Potassium: 3.8 mmol/L (ref 3.5–5.1)
Sodium: 135 mmol/L (ref 135–145)
Total Bilirubin: 0.7 mg/dL (ref 0.3–1.2)
Total Protein: 6.3 g/dL — ABNORMAL LOW (ref 6.5–8.1)

## 2019-02-12 LAB — C-REACTIVE PROTEIN: CRP: 16.3 mg/dL — ABNORMAL HIGH (ref <1.0)

## 2019-02-12 LAB — LACTIC ACID, PLASMA: Lactic Acid, Venous: 1 mmol/L (ref 0.5–1.9)

## 2019-02-12 LAB — FIBRINOGEN: Fibrinogen: >800 mg/dL — ABNORMAL HIGH (ref 210–475)

## 2019-02-12 LAB — TRIGLYCERIDES: Triglycerides: 138 mg/dL (ref <150)

## 2019-02-12 LAB — FERRITIN: Ferritin: 515 ng/mL — ABNORMAL HIGH (ref 24–336)

## 2019-02-12 LAB — D-DIMER, QUANTITATIVE: D-Dimer, Quant: 1.41 ug/mL-FEU — ABNORMAL HIGH (ref 0.00–0.50)

## 2019-02-12 LAB — ABO/RH: ABO/RH(D): O POS

## 2019-02-12 LAB — PROCALCITONIN: Procalcitonin: <0.1 ng/mL

## 2019-02-12 LAB — LACTATE DEHYDROGENASE: LDH: 493 U/L — ABNORMAL HIGH (ref 98–192)

## 2019-02-12 MED ORDER — AMLODIPINE BESYLATE 5 MG PO TABS
5.0000 mg | ORAL_TABLET | Freq: Every day | ORAL | Status: DC
  Administered 2019-02-13: 5 mg via ORAL
  Filled 2019-02-12: qty 1

## 2019-02-12 MED ORDER — ENOXAPARIN SODIUM 40 MG/0.4ML ~~LOC~~ SOLN: 40.0000 mg | SUBCUTANEOUS | Status: DC

## 2019-02-12 MED ORDER — SODIUM CHLORIDE 0.9 % IV SOLN
100.0000 mg | INTRAVENOUS | Status: AC
  Administered 2019-02-13 – 2019-02-16 (×4): 100 mg via INTRAVENOUS
  Filled 2019-02-12 (×4): qty 20

## 2019-02-12 MED ORDER — METHYLPREDNISOLONE SODIUM SUCC 125 MG IJ SOLR
80.0000 mg | Freq: Two times a day (BID) | INTRAMUSCULAR | Status: DC
  Administered 2019-02-12 – 2019-02-13 (×2): 80 mg via INTRAVENOUS
  Filled 2019-02-12 (×2): qty 2

## 2019-02-12 MED ORDER — SODIUM CHLORIDE 0.9 % IV SOLN
200.0000 mg | Freq: Once | INTRAVENOUS | Status: AC
  Administered 2019-02-12: 200 mg via INTRAVENOUS
  Filled 2019-02-12: qty 40

## 2019-02-12 MED ORDER — TOCILIZUMAB 400 MG/20ML IV SOLN
8.0000 mg/kg | Freq: Once | INTRAVENOUS | Status: AC
  Administered 2019-02-12: 23:00:00 672 mg via INTRAVENOUS
  Filled 2019-02-12: qty 33.6

## 2019-02-12 NOTE — Plan of Care (Signed)
  Problem: Education: Goal: Knowledge of risk factors and measures for prevention of condition will improve Outcome: Not Progressing   Problem: Coping: Goal: Psychosocial and spiritual needs will be supported Outcome: Not Progressing   Problem: Respiratory: Goal: Will maintain a patent airway Outcome: Not Progressing Goal: Complications related to the disease process, condition or treatment will be avoided or minimized Outcome: Not Progressing   Problem: Education: Goal: Knowledge of General Education information will improve Description: Including pain rating scale, medication(s)/side effects and non-pharmacologic comfort measures Outcome: Not Progressing   Problem: Health Behavior/Discharge Planning: Goal: Ability to manage health-related needs will improve Outcome: Not Progressing   Problem: Clinical Measurements: Goal: Respiratory complications will improve Outcome: Not Progressing   Problem: Activity: Goal: Risk for activity intolerance will decrease Outcome: Not Progressing   Problem: Nutrition: Goal: Adequate nutrition will be maintained Outcome: Not Progressing   Problem: Elimination: Goal: Will not experience complications related to bowel motility Outcome: Not Progressing Goal: Will not experience complications related to urinary retention Outcome: Not Progressing   Problem: Safety: Goal: Ability to remain free from injury will improve Outcome: Not Progressing   Problem: Skin Integrity: Goal: Risk for impaired skin integrity will decrease Outcome: Not Progressing  NEW ADMISSION

## 2019-02-12 NOTE — ED Triage Notes (Signed)
Patient reports increasing coughing and work of breathing, he says his fever has gone away, but he feels he is getting worse.  Since testing positive with Covid 15 days ago, he has only taking Ibuprofen.

## 2019-02-12 NOTE — ED Triage Notes (Signed)
Pt in with sob, weakness and cough. Tested Covid + 15 days ago. sats 92% on RA in triage, states hard to take deep breath in

## 2019-02-12 NOTE — H&P (Signed)
History and Physical:    Evan Freeman   LNL:[id number] DOB: 22-Jan-1971 DOA: 02/12/2019  Referring MD/provider: PA [place] PCP: Colon Branch, MD   Patient coming from: Home  Chief Complaint: Fatigue shortness of breath and cough with known COVID positivity  History of Present Illness:   Evan Freeman is an 48 y.o. male with past medical history significant for hypertension who was in his usual state of health until 2 weeks ago when he developed diarrhea associated with fevers fatigue headache and body aches.  Patient was seen by his PCP and [facility] and was tested positive for SARS-CoV-2.  His wife was also tested and she was also positive.  Patient and wife went home and treated themselves conservatively.  Patient's wife had resolution of all of her symptoms after 5 days.  Patient states his diarrhea resolved but that his fever persisted until about 4 days ago when he had resolution of his fever.  Patient notes however that his cough has persisted and has gotten worse over the past 4 days.  He also notes that he has become increasingly short of breath to the point where he is short of breath just getting up out of bed.  He has severe fatigue although his myalgias and headache have resolved.  Patient denies any chest pain.  He denies that his cough is productive.  No hemoptysis.  Patient denies previous history of tuberculosis.  Patient denies any IV drug use or blood transfusion.  Patient denies previous history of hepatitis.  Patient does not smoke and he has social use of alcohol, none for at least 2 weeks.  ED Course:  The patient was noted to have O2 sats of 88% while attempting to ambulate and noted to have bilateral infiltrates on chest x-ray consistent with COVID.  ROS:   ROS   Review of Systems: Endocrine: no heat/cold intolerance, no polyuria Respiratory: Positive cough,, shortness of breath, no hemoptysis Cardiovascular: No palpitations, chest pain GI: No  nausea, vomiting,  constipation GU: No dysuria, increased frequency CNS: No numbness, dizziness, headache Musculoskeletal: No back pain, joint pain Blood/lymphatics: No easy bruising, bleeding Mood/affect: No anxiety/depression    Past Medical History:   Past Medical History:  Diagnosis Date  . Abdominal pain, chronic, epigastric 06/14/2014  . Elevated BP   . Gallbladder polyp 08/05/2014  . History of stomach ulcers    EGD 2010  . Hyperlipidemia   . Infectious colitis     Past Surgical History:   Past Surgical History:  Procedure Laterality Date  . NO PAST SURGERIES      Social History:   Social History   Socioeconomic History  . Marital status: Married    Spouse name: Not on file  . Number of children: 2  . Years of education: Not on file  . Highest education level: Not on file  Occupational History  . Occupation: Insurance underwriter, Agricultural consultant: [employer]  . Financial resource strain: Not on file  . Food insecurity    Worry: Not on file    Inability: Not on file  . Transportation needs    Medical: Not on file    Non-medical: Not on file  Tobacco Use  . Smoking status: Never Smoker  . Smokeless tobacco: Never Used  Substance and Sexual Activity  . Alcohol use: Yes    Alcohol/week: 0.0 standard drinks    Comment: rare  . Drug use: No  . Sexual activity: Not on file  Lifestyle  . Physical activity    Days per week: Not on file    Minutes per session: Not on file  . Stress: Not on file  Relationships  . Social Herbalist on phone: Not on file    Gets together: Not on file    Attends religious service: Not on file    Active member of club or organization: Not on file    Attends meetings of clubs or organizations: Not on file    Relationship status: Not on file  . Intimate partner violence    Fear of current or ex partner: Not on file    Emotionally abused: Not on file    Physically abused: Not on file    Forced sexual  activity: Not on file  Other Topics Concern  . Not on file  Social History Narrative   Original from [place]   7 grade education   Household-- pt, wife, daughter 24; son 1998, both live w/ him    Allergies   Patient has no known allergies.  Family history:   Family History  Problem Relation Age of Onset  . Gastric cancer Other        father, brother and cousin  . CAD Mother   . Hypertension Mother   . Stomach cancer Father   . Stomach cancer Brother   . Prostate cancer Neg Hx   . Colon cancer Neg Hx   . Diabetes Neg Hx   . Esophageal cancer Neg Hx     Current Medications:   Prior to Admission medications   Medication Sig Start Date End Date Taking? Authorizing Provider  amLODipine (NORVASC) 5 MG tablet Take 1 tablet (5 mg total) by mouth daily. 01/05/19  Yes Paz, Alda Berthold, MD  ibuprofen (ADVIL) 200 MG tablet Take 600 mg by mouth every 8 (eight) hours as needed for fever, headache or mild pain.    Yes [provider]  vitamin C (ASCORBIC ACID) 500 MG tablet Take 500 mg by mouth daily.   Yes [provider]    Physical Exam:   Vitals:   02/12/19 1530 02/12/19 1545 02/12/19 1600 02/12/19 1615  BP: (!) 117/95 (!) 142/101 (!) 140/95 (!) 137/98  Pulse: 75 84 90 94  Resp: (!) 38 (!) 28 (!) 34 (!) 32  Temp:      TempSrc:      SpO2: (!) 87% 96% 97% 90%     Physical Exam: Blood pressure (!) 137/98, pulse 94, temperature 99.4 F (37.4 C), temperature source Oral, resp. rate (!) 32, SpO2 90 %. Gen: Tired but otherwise well-appearing man lying flat in bed in no acute distress, able to speak in full sentences without difficulty. Eyes: Sclerae anicteric. Conjunctiva mildly injected. Neck: Supple, no jugular venous distention. Chest: Moderately good air entry bilaterally with rales at bases.  CV: Distant, regular, no audible murmurs. Abdomen: NABS, soft, nondistended, nontender. No tenderness to light or deep palpation. No rebound, no guarding. Extremities:  No edema.  Neuro: Alert and oriented times 3; grossly nonfocal. Psych: Patient is cooperative, logical and coherent with appropriate mood and affect.  Data Review:    Labs: Basic Metabolic Panel: Recent Labs  Lab 02/12/19 1212  NA 135  K 3.8  CL 100  CO2 24  GLUCOSE 135*  BUN 12  CREATININE 0.92  CALCIUM 8.2*   Liver Function Tests: Recent Labs  Lab 02/12/19 1212  AST 50*  ALT 42  ALKPHOS 64  BILITOT 0.7  PROT 6.3*  ALBUMIN 2.5*   No results for input(s): LIPASE, AMYLASE in the last 168 hours. No results for input(s): AMMONIA in the last 168 hours. CBC: Recent Labs  Lab 02/12/19 1212  WBC 6.6  NEUTROABS 5.7  HGB 13.5  HCT 41.1  MCV 83.7  PLT 315   Cardiac Enzymes: No results for input(s): CKTOTAL, CKMB, CKMBINDEX, TROPONINI in the last 168 hours.  BNP (last 3 results) No results for input(s): PROBNP in the last 8760 hours. CBG: No results for input(s): GLUCAP in the last 168 hours.  Urinalysis    Component Value Date/Time   COLORURINE YELLOW 03/28/2009 0940   APPEARANCEUR CLEAR 03/28/2009 0940   LABSPEC 1.028 03/28/2009 0940   PHURINE 7.5 03/28/2009 0940   GLUCOSEU NEGATIVE 03/28/2009 0940   HGBUR NEGATIVE 03/28/2009 0940   BILIRUBINUR NEGATIVE 03/28/2009 0940   KETONESUR TRACE (A) 03/28/2009 0940   PROTEINUR 30 (A) 03/28/2009 0940   UROBILINOGEN 0.2 03/28/2009 0940   NITRITE NEGATIVE 03/28/2009 0940   LEUKOCYTESUR NEGATIVE 03/28/2009 0940      Radiographic Studies: Dg Chest Port 1 View  Result Date: 02/12/2019 CLINICAL DATA:  Shortness of breath and fever EXAM: PORTABLE CHEST 1 VIEW COMPARISON:  None. FINDINGS: Bilateral interstitial and patchy alveolar airspace opacities. No pleural effusion or pneumothorax. Normal cardiomediastinal silhouette. No aggressive osseous lesion. IMPRESSION: 1. Bilateral interstitial and patchy alveolar airspace opacities which may reflect mild pulmonary edema versus multilobar pneumonia. Electronically Signed    By: Kathreen Devoid   On: 02/12/2019 12:39    EKG: Ordered and pending   Assessment/Plan:   Principal Problem:   COVID-19 virus infection Active Problems:   Essential hypertension   COVID 19 Patient with positive SARS-CoV-2 on June 8 per [facility] chart viewable on care everywhere. Now with symptoms of worsened cough and shortness of breath with new oxygen requirement. He has had resolution of fever.  Laboratory data is notable for lack of leukocytosis. Will admit patient to [facility] for management of COVID-19. Continue oxygen, Solu-Medrol 80 every 12 ordered. Pharmacy consult placed for Remdesevir Further management including Actrema per treatment   HTN Continue amlodipine 5 mg daily per home doses     Other information:   DVT prophylaxis: Lovenox ordered. Code Status: Full code. Family Communication: She states his wife knows that he is in house Disposition Plan: Home Consults called: None Admission status: Inpatient   The medical decision making is of moderate complexity, therefore this is a level 2 visit.  Dewaine Oats Tublu Cherl Gorney [facility]  If 7PM-7AM, please contact night-coverage [email or web address] Password [facility] 02/12/2019, 4:59 PM

## 2019-02-12 NOTE — ED Provider Notes (Signed)
[facility] EMERGENCY DEPARTMENT Provider Note   CSN: [id number] Arrival date & time: 02/12/19  1140    History   Chief Complaint Chief Complaint  Patient presents with  . Respiratory Distress    HPI Evan Freeman is a 48 y.o. male presenting for evaluation of cough and SOB.   Pt states he was tested positive for covid 15 days ago. He had fever for 10 days, but fever has since resolved. Pt states he is still taking ibuprofen, last dose at 1000 today. Pt states he has had worsening cough and SOB for the past several days. He denies ha, st, cp, n/v, abd pain, urinary sxs, or abnormal BMs. H/o htn, but pcp d/c his bp meds. Pt states his was was sick, but she has improved. He states he has been feeling very weak and tired. He denies tobacco, etoh, or drug use.   Additional history obtained from chart review.  COVID test positive on 02/05/2019 at [facility].     HPI  Past Medical History:  Diagnosis Date  . Abdominal pain, chronic, epigastric 06/14/2014  . Elevated BP   . Gallbladder polyp 08/05/2014  . History of stomach ulcers    EGD 2010  . Hyperlipidemia   . Infectious colitis     Patient Active Problem List   Diagnosis Date Noted  . COVID-19 virus infection 02/12/2019  . Essential hypertension 02/12/2019  . Follow-up --------------PCP notes  05/13/2015  . Annual physical exam 05/12/2015  . Gallbladder polyp 08/05/2014  . Abdominal pain, chronic, epigastric 06/14/2014  . Elevated BP   . ABDOMINAL PAIN, EPIGASTRIC 04/30/2009    Past Surgical History:  Procedure Laterality Date  . NO PAST SURGERIES          Home Medications    Prior to Admission medications   Medication Sig Start Date End Date Taking? Authorizing Provider  amLODipine (NORVASC) 5 MG tablet Take 1 tablet (5 mg total) by mouth daily. 01/05/19  Yes Paz, Alda Berthold, MD  ibuprofen (ADVIL) 200 MG tablet Take 600 mg by mouth every 8 (eight) hours as needed for fever, headache or mild  pain.    Yes [provider]  vitamin C (ASCORBIC ACID) 500 MG tablet Take 500 mg by mouth daily.   Yes [provider]    Family History Family History  Problem Relation Age of Onset  . Gastric cancer Other        father, brother and cousin  . CAD Mother   . Hypertension Mother   . Stomach cancer Father   . Stomach cancer Brother   . Prostate cancer Neg Hx   . Colon cancer Neg Hx   . Diabetes Neg Hx   . Esophageal cancer Neg Hx     Social History Social History   Tobacco Use  . Smoking status: Never Smoker  . Smokeless tobacco: Never Used  Substance Use Topics  . Alcohol use: Yes    Alcohol/week: 0.0 standard drinks    Comment: rare  . Drug use: No     Allergies   Patient has no known allergies.   Review of Systems Review of Systems  Constitutional: Positive for fever (resolved).  Respiratory: Positive for cough and shortness of breath.   Neurological: Positive for weakness.  All other systems reviewed and are negative.    Physical Exam Updated Vital Signs BP (!) 137/98   Pulse 94   Temp 99.4 F (37.4 C) (Oral)   Resp (!) 32  SpO2 90%   Physical Exam Vitals signs and nursing note reviewed.  Constitutional:      General: He is not in acute distress.    Appearance: He is well-developed.     Comments: Breathing quickly, otherwise nontoxic  HENT:     Head: Normocephalic and atraumatic.  Eyes:     Conjunctiva/sclera: Conjunctivae normal.     Pupils: Pupils are equal, round, and reactive to light.  Neck:     Musculoskeletal: Normal range of motion and neck supple.  Cardiovascular:     Rate and Rhythm: Regular rhythm.     Comments: Mild tachycardia 100-105 Pulmonary:     Effort: Tachypnea present. No respiratory distress or retractions.     Breath sounds: Normal breath sounds. No wheezing.     Comments: Tachypnea btwn 30 and 40 at rest. With standing (not ambulation) RR goes 40-50. SpO2 drops to 88% on standing.  Abdominal:      General: There is no distension.     Palpations: Abdomen is soft. There is no mass.     Tenderness: There is no abdominal tenderness. There is no guarding or rebound.  Musculoskeletal: Normal range of motion.     Right lower leg: No edema.     Left lower leg: No edema.     Comments: No leg swelling  Skin:    General: Skin is warm and dry.     Capillary Refill: Capillary refill takes less than 2 seconds.  Neurological:     Mental Status: He is alert and oriented to person, place, and time.      ED Treatments / Results  Labs (all labs ordered are listed, but only abnormal results are displayed) Labs Reviewed  COMPREHENSIVE METABOLIC PANEL - Abnormal; Notable for the following components:      Result Value   Glucose, Bld 135 (*)    Calcium 8.2 (*)    Total Protein 6.3 (*)    Albumin 2.5 (*)    AST 50 (*)    All other components within normal limits  D-DIMER, QUANTITATIVE (NOT AT [facility]) - Abnormal; Notable for the following components:   D-Dimer, Quant 1.41 (*)    All other components within normal limits  LACTATE DEHYDROGENASE - Abnormal; Notable for the following components:   LDH 493 (*)    All other components within normal limits  FIBRINOGEN - Abnormal; Notable for the following components:   Fibrinogen >800 (*)    All other components within normal limits  C-REACTIVE PROTEIN - Abnormal; Notable for the following components:   CRP 16.3 (*)    All other components within normal limits  FERRITIN - Abnormal; Notable for the following components:   Ferritin 515 (*)    All other components within normal limits  CULTURE, BLOOD (ROUTINE X 2)  CULTURE, BLOOD (ROUTINE X 2)  CBC WITH DIFFERENTIAL/PLATELET  LACTIC ACID, PLASMA  PROCALCITONIN  TRIGLYCERIDES  LACTIC ACID, PLASMA  HIV ANTIBODY (ROUTINE TESTING W REFLEX)  CBC  CREATININE, SERUM  C-REACTIVE PROTEIN  FERRITIN  PROCALCITONIN  SEDIMENTATION RATE  ABO/RH    EKG None  Radiology Dg Chest Port 1 View   Result Date: 02/12/2019 CLINICAL DATA:  Shortness of breath and fever EXAM: PORTABLE CHEST 1 VIEW COMPARISON:  None. FINDINGS: Bilateral interstitial and patchy alveolar airspace opacities. No pleural effusion or pneumothorax. Normal cardiomediastinal silhouette. No aggressive osseous lesion. IMPRESSION: 1. Bilateral interstitial and patchy alveolar airspace opacities which may reflect mild pulmonary edema versus multilobar pneumonia. Electronically Signed  By: Kathreen Devoid   On: 02/12/2019 12:39    Procedures Procedures (including critical care time)  Medications Ordered in ED Medications  amLODipine (NORVASC) tablet 5 mg (has no administration in time range)  enoxaparin (LOVENOX) injection 40 mg (has no administration in time range)  methylPREDNISolone sodium succinate (SOLU-MEDROL) 125 mg/2 mL injection 80 mg (has no administration in time range)     Initial Impression / Assessment and Plan / ED Course  I have reviewed the triage vital signs and the nursing notes.  Pertinent labs & imaging results that were available during my care of the patient were reviewed by me and considered in my medical decision making (see chart for details).        Presenting for evaluation of worsening shortness of breath and cough.  Physical exam concerning, patient is very tachypneic.  Additionally, pulse ox low at baseline.  I stood patient up, not ambulated, and patient became more tachypneic and his sats dropped to the upper 80s.  Patient was very weak, had difficulty standing.  I am concerned about his worsening respiratory status in the setting of a positive coronavirus test.  Will obtain labs, chest x-ray, EKG, but patient will likely need to be admitted.  Labs consistent with coronavirus, elevated dimer, fibrinogen, CRP, LDH, and ferritin.  Chest x-ray viewed interpreted by me, consistent with multilobar pneumonia.  If this is likely due to coronavirus, will hold on antibiotics, in the setting of  improving fever and no white count.  Will call for admission.  Discussed with Dr. Jamse Arn [facility], patient to be admitted.   Evan Freeman was evaluated in Emergency Department on 02/12/2019 for the symptoms described in the history of present illness. He was evaluated in the context of the global COVID-19 pandemic, which necessitated consideration that the patient might be at risk for infection with the SARS-CoV-2 virus that causes COVID-19. Institutional protocols and algorithms that pertain to the evaluation of patients at risk for COVID-19 are in a state of rapid change based on information released by regulatory bodies including the CDC and federal and state organizations. These policies and algorithms were followed during the patient's care in the ED.   Final Clinical Impressions(s) / ED Diagnoses   Final diagnoses:  [id number]    ED Discharge Orders    None       Franchot Heidelberg, PA-C 02/12/19 [place], DO 02/13/19 1310

## 2019-02-12 NOTE — ED Notes (Signed)
[name] ([facility] called @ 1636-per Tiffany, RN, called by Levada Dy

## 2019-02-13 LAB — D-DIMER, QUANTITATIVE: D-Dimer, Quant: 1.36 ug/mL-FEU — ABNORMAL HIGH (ref 0.00–0.50)

## 2019-02-13 LAB — COMPREHENSIVE METABOLIC PANEL
ALT: 41 U/L (ref 0–44)
AST: 42 U/L — ABNORMAL HIGH (ref 15–41)
Albumin: 2.5 g/dL — ABNORMAL LOW (ref 3.5–5.0)
Alkaline Phosphatase: 67 U/L (ref 38–126)
Anion gap: 12 (ref 5–15)
BUN: 18 mg/dL (ref 6–20)
CO2: 25 mmol/L (ref 22–32)
Calcium: 8.5 mg/dL — ABNORMAL LOW (ref 8.9–10.3)
Chloride: 103 mmol/L (ref 98–111)
Creatinine, Ser: 0.71 mg/dL (ref 0.61–1.24)
GFR calc Af Amer: >60 mL/min (ref >60)
GFR calc non Af Amer: >60 mL/min (ref >60)
Glucose, Bld: 163 mg/dL — ABNORMAL HIGH (ref 70–99)
Potassium: 4.1 mmol/L (ref 3.5–5.1)
Sodium: 140 mmol/L (ref 135–145)
Total Bilirubin: 0.4 mg/dL (ref 0.3–1.2)
Total Protein: 6.5 g/dL (ref 6.5–8.1)

## 2019-02-13 LAB — CBC
HCT: 42.6 % (ref 39.0–52.0)
Hemoglobin: 13.7 g/dL (ref 13.0–17.0)
MCH: 27.2 pg (ref 26.0–34.0)
MCHC: 32.2 g/dL (ref 30.0–36.0)
MCV: 84.5 fL (ref 80.0–100.0)
Platelets: 358 10*3/uL (ref 150–400)
RBC: 5.04 MIL/uL (ref 4.22–5.81)
RDW: 13.2 % (ref 11.5–15.5)
WBC: 4.3 10*3/uL (ref 4.0–10.5)
nRBC: 0 % (ref 0.0–0.2)

## 2019-02-13 LAB — HIV ANTIBODY (ROUTINE TESTING W REFLEX): HIV Screen 4th Generation wRfx: NONREACTIVE

## 2019-02-13 LAB — FERRITIN: Ferritin: 551 ng/mL — ABNORMAL HIGH (ref 24–336)

## 2019-02-13 LAB — C-REACTIVE PROTEIN: CRP: 17.8 mg/dL — ABNORMAL HIGH (ref <1.0)

## 2019-02-13 LAB — LACTATE DEHYDROGENASE: LDH: 415 U/L — ABNORMAL HIGH (ref 98–192)

## 2019-02-13 LAB — MAGNESIUM: Magnesium: 2.3 mg/dL (ref 1.7–2.4)

## 2019-02-13 MED ORDER — HYDRALAZINE HCL 20 MG/ML IJ SOLN: 10.0000 mg | Freq: Four times a day (QID) | INTRAMUSCULAR | Status: DC | PRN

## 2019-02-13 MED ORDER — AMLODIPINE BESYLATE 5 MG PO TABS
10.0000 mg | ORAL_TABLET | Freq: Every day | ORAL | Status: DC
  Administered 2019-02-14 – 2019-02-16 (×3): 10 mg via ORAL
  Filled 2019-02-13 (×4): qty 2

## 2019-02-13 MED ORDER — ENOXAPARIN SODIUM 40 MG/0.4ML ~~LOC~~ SOLN
40.0000 mg | SUBCUTANEOUS | Status: DC
  Administered 2019-02-13 – 2019-02-17 (×5): 40 mg via SUBCUTANEOUS
  Filled 2019-02-13 (×5): qty 0.4

## 2019-02-13 MED ORDER — METHYLPREDNISOLONE SODIUM SUCC 125 MG IJ SOLR
60.0000 mg | Freq: Two times a day (BID) | INTRAMUSCULAR | Status: DC
  Administered 2019-02-13 – 2019-02-14 (×2): 60 mg via INTRAVENOUS
  Filled 2019-02-13 (×2): qty 2

## 2019-02-13 MED ORDER — AMLODIPINE BESYLATE 5 MG PO TABS: 5.0000 mg | ORAL_TABLET | Freq: Every day | ORAL | Status: AC

## 2019-02-13 NOTE — Progress Notes (Signed)
Ok to resume DVT prophylaxis lovenox per Dr Candiss Norse.  Onnie Boer, PharmD, BCIDP, AAHIVP, CPP Infectious Disease Pharmacist 02/13/2019 9:22 AM

## 2019-02-13 NOTE — Progress Notes (Signed)
Pt arrived room apprx 2010. He continues to exhibit SOB. He denies pain, cardiac, respiratory hx. He sees Dr. Dolphus Jenny on regular tx for HTN. Pt and spouse were tested positive for covid 15 days ago. Spouse gotten better, but he didn't. Skin intact. Lungs diminished with some rales.. Educated on the incentive spirometry encouraged him to use. He would need more encouragement to use the incentive. He notes he is weak, BLE.He has consume ETOH in the past.   He has a brown wallet at bedside, along with clothing, cell phone.   Staff will continue to monitor and meet needs.

## 2019-02-13 NOTE — Progress Notes (Signed)
Lying left side

## 2019-02-13 NOTE — Progress Notes (Signed)
Used interpretor during assessment and rounding. Asked patient if he wanted [place] to call his wife with the interpretor, and he declined. He said he would call her with the information I gave, he understands that he will remain in the hospital approximately 2 more days. He did not have any other questions or concerns and understands that the interpretor is available at any time. He is resting comfortably at this time.

## 2019-02-13 NOTE — Progress Notes (Signed)
Administered O2, 2L @0430 , increased O2 4L @ 0630.  Reeducated use of  incentive

## 2019-02-13 NOTE — Plan of Care (Signed)
  Problem: Education: Goal: Knowledge of risk factors and measures for prevention of condition will improve Outcome: Progressing   Problem: Coping: Goal: Psychosocial and spiritual needs will be supported Outcome: Progressing   Problem: Respiratory: Goal: Will maintain a patent airway Outcome: Progressing Goal: Complications related to the disease process, condition or treatment will be avoided or minimized Outcome: Progressing   Problem: Education: Goal: Knowledge of General Education information will improve Description: Including pain rating scale, medication(s)/side effects and non-pharmacologic comfort measures Outcome: Progressing   Problem: Health Behavior/Discharge Planning: Goal: Ability to manage health-related needs will improve Outcome: Progressing   Problem: Clinical Measurements: Goal: Respiratory complications will improve Outcome: Progressing   Problem: Activity: Goal: Risk for activity intolerance will decrease Outcome: Progressing   

## 2019-02-13 NOTE — Progress Notes (Signed)
PROGRESS NOTE                                                                                                                                                                                                             Patient Demographics:    Evan Freeman, is a 48 y.o. male, DOB - 21-Aug-1971, MOL:[id number]  Outpatient Primary MD for the patient is Colon Branch, MD    LOS - 1  Admit date - 02/12/2019    Chief Complaint  Patient presents with  . Respiratory Distress       Brief Narrative  Thaddaeus Granja is an 48 y.o. male with past medical history significant for hypertension who was in his usual state of health until 2 weeks ago when he developed diarrhea associated with fevers fatigue headache and body aches.  Patient was seen by his PCP and [facility] and was tested positive for SARS-CoV-2 at [facility] facility about 1 week ago.  He had low-grade fevers at home but gradually developed more shortness of breath and presented to the ER where he was diagnosed with COVID-19 pneumonitis with acute hypoxic aspiratory failure and was admitted to the hospital.   Subjective:    Ammiel Nieto-Amaya today has, No headache, No chest pain, No abdominal pain - No Nausea, No new weakness tingling or numbness, No Cough - improved SOB.     Assessment  & Plan :     1. Acute Hypoxic Resp. Failure due to Acute Covid 19 Viral pneumonitis during the ongoing 2020 Covid 19 Pandemic - he received combination of IV steroids, REMDESIVIR along with Actemra on 02/12/2019.  Inflammatory markers have stabilized and clinically he says he feels lot better, has minimal shortness of breath and currently on 4 L nasal cannula oxygen which is slight improvement, overall appears nontoxic continue to monitor.  Encouraged to increase activity, sit up in chair, use I-S along with flutter valve for pulmonary toiletry.  Continue to monitor closely.     COVID-19 Labs  Recent Labs    02/12/19 1212 02/12/19 1415 02/13/19 0428  DDIMER 1.41*  --  1.36*  FERRITIN  --  515* 551*  LDH 493*  --  415*  CRP  --  16.3* 17.8*    No results found for: SARSCOV2NAA   Hepatic Function Latest Ref Rng & Units 02/13/2019 02/12/2019 01/05/2019  Total Protein 6.5 - 8.1 g/dL 6.5 6.3(L) 7.0  Albumin 3.5 - 5.0 g/dL 2.5(L) 2.5(L) 4.4  AST 15 - 41 U/L 42(H) 50(H) 18  ALT 0 - 44 U/L 41 42 27  Alk Phosphatase 38 - 126 U/L 67 64 59  Total Bilirubin 0.3 - 1.2 mg/dL 0.4 0.7 0.7     No results found for: BNP    2.  Mildly elevated LFTs due to combination of COVID-19 infection and Actemra use.  No abdominal discomfort.  Trend.  3.  Hypertension.  On low-dose Norvasc continue, add as needed IV hydralazine for better control and monitor.    Condition - Extremely Guarded  Family Communication  :  None  Code Status :  Full  Diet :   Diet Order            Diet regular Room service appropriate? Yes; Fluid consistency: Thin  Diet effective now               Disposition Plan  :  Home in 3-4 days  Consults  :  None  Procedures  : None  PUD Prophylaxis : none  DVT Prophylaxis  :  Lovenox    Lab Results  Component Value Date   PLT 358 02/13/2019    Inpatient Medications  Scheduled Meds: . amLODipine  5 mg Oral Daily  . enoxaparin (LOVENOX) injection  40 mg Subcutaneous Q24H  . methylPREDNISolone (SOLU-MEDROL) injection  60 mg Intravenous Q12H   Continuous Infusions: . remdesivir 100 mg in NS 250 mL     PRN Meds:.  Antibiotics  :    Anti-infectives (From admission, onward)   Start     Dose/Rate Route Frequency Ordered Stop   02/13/19 2200  remdesivir 100 mg in sodium chloride 0.9 % 230 mL IVPB     100 mg 500 mL/hr over 30 Minutes Intravenous Every 24 hours 02/12/19 2008 02/17/19 2159   02/12/19 2200  remdesivir 200 mg in sodium chloride 0.9 % 210 mL IVPB     200 mg 500 mL/hr over 30 Minutes Intravenous Once 02/12/19 2008  02/12/19 2255       Time Spent in minutes  30   Lala Lund M.D on 02/13/2019 at 10:44 AM  To page go to [email or web address] - password [facility]  [facility] -  Office  [phone]    See all Orders from today for further details    Objective:   Vitals:   02/12/19 2058 02/13/19 0436 02/13/19 0729 02/13/19 1033  BP:  (!) 132/92  (!) 131/93  Pulse:  91    Resp:  (!) 26    Temp:  98.9 F (37.2 C) 98.6 F (37 C)   TempSrc:  Oral Oral   SpO2:  90%    Weight: 79.6 kg     Height: '5\' 6"'  (1.676 m)       Wt Readings from Last 3 Encounters:  02/12/19 79.6 kg  06/29/17 85 kg  06/01/16 82.6 kg     Intake/Output Summary (Last 24 hours) at 02/13/2019 1044 Last data filed at 02/13/2019 0500 Gross per 24 hour  Intake 480 ml  Output 300 ml  Net 180 ml     Physical Exam  Awake Alert, Oriented X 3, No new F.N deficits, Normal affect [place].AT,PERRAL Supple Neck,No JVD, No cervical lymphadenopathy appriciated.  Symmetrical Chest wall movement, Good air movement bilaterally, CTAB RRR,No Gallops,Rubs or new Murmurs, No Parasternal Heave +ve B.Sounds, Abd Soft, No tenderness, No organomegaly appriciated, No  rebound - guarding or rigidity. No Cyanosis, Clubbing or edema, No new Rash or bruise       Data Review:    CBC Recent Labs  Lab 02/12/19 1212 02/13/19 0428  WBC 6.6 4.3  HGB 13.5 13.7  HCT 41.1 42.6  PLT 315 358  MCV 83.7 84.5  MCH 27.5 27.2  MCHC 32.8 32.2  RDW 13.2 13.2  LYMPHSABS 0.7  --   MONOABS 0.1  --   EOSABS 0.1  --   BASOSABS 0.0  --     Chemistries  Recent Labs  Lab 02/12/19 1212 02/13/19 0428 02/13/19 0548  NA 135 140  --   K 3.8 4.1  --   CL 100 103  --   CO2 24 25  --   GLUCOSE 135* 163*  --   BUN 12 18  --   CREATININE 0.92 0.71  --   CALCIUM 8.2* 8.5*  --   MG  --   --  2.3  AST 50* 42*  --   ALT 42 41  --   ALKPHOS 64 67  --   BILITOT 0.7 0.4  --     ------------------------------------------------------------------------------------------------------------------ Recent Labs    02/12/19 1212  TRIG 138    Lab Results  Component Value Date   HGBA1C 5.9 01/05/2019   ------------------------------------------------------------------------------------------------------------------ No results for input(s): TSH, T4TOTAL, T3FREE, THYROIDAB in the last 72 hours.  Invalid input(s): FREET3  Cardiac Enzymes No results for input(s): CKMB, TROPONINI, MYOGLOBIN in the last 168 hours.  Invalid input(s): CK ------------------------------------------------------------------------------------------------------------------ No results found for: BNP  Micro Results No results found for this or any previous visit (from the past 240 hour(s)).  Radiology Reports Dg Chest Port 1 View  Result Date: 02/12/2019 CLINICAL DATA:  Shortness of breath and fever EXAM: PORTABLE CHEST 1 VIEW COMPARISON:  None. FINDINGS: Bilateral interstitial and patchy alveolar airspace opacities. No pleural effusion or pneumothorax. Normal cardiomediastinal silhouette. No aggressive osseous lesion. IMPRESSION: 1. Bilateral interstitial and patchy alveolar airspace opacities which may reflect mild pulmonary edema versus multilobar pneumonia. Electronically Signed   By: Kathreen Devoid   On: 02/12/2019 12:39

## 2019-02-14 LAB — CBC WITH DIFFERENTIAL/PLATELET
Abs Immature Granulocytes: 0.11 10*3/uL — ABNORMAL HIGH (ref 0.00–0.07)
Basophils Absolute: 0 10*3/uL (ref 0.0–0.1)
Basophils Relative: 0 %
Eosinophils Absolute: 0 10*3/uL (ref 0.0–0.5)
Eosinophils Relative: 0 %
HCT: 39.9 % (ref 39.0–52.0)
Hemoglobin: 13.2 g/dL (ref 13.0–17.0)
Immature Granulocytes: 1 %
Lymphocytes Relative: 10 %
Lymphs Abs: 1.2 10*3/uL (ref 0.7–4.0)
MCH: 28.1 pg (ref 26.0–34.0)
MCHC: 33.1 g/dL (ref 30.0–36.0)
MCV: 84.9 fL (ref 80.0–100.0)
Monocytes Absolute: 0.5 10*3/uL (ref 0.1–1.0)
Monocytes Relative: 4 %
Neutro Abs: 11 10*3/uL — ABNORMAL HIGH (ref 1.7–7.7)
Neutrophils Relative %: 85 %
Platelets: 484 10*3/uL — ABNORMAL HIGH (ref 150–400)
RBC: 4.7 MIL/uL (ref 4.22–5.81)
RDW: 13.4 % (ref 11.5–15.5)
WBC: 12.9 10*3/uL — ABNORMAL HIGH (ref 4.0–10.5)
nRBC: 0 % (ref 0.0–0.2)

## 2019-02-14 LAB — COMPREHENSIVE METABOLIC PANEL
ALT: 38 U/L (ref 0–44)
AST: 30 U/L (ref 15–41)
Albumin: 2.5 g/dL — ABNORMAL LOW (ref 3.5–5.0)
Alkaline Phosphatase: 59 U/L (ref 38–126)
Anion gap: 11 (ref 5–15)
BUN: 21 mg/dL — ABNORMAL HIGH (ref 6–20)
CO2: 26 mmol/L (ref 22–32)
Calcium: 8.8 mg/dL — ABNORMAL LOW (ref 8.9–10.3)
Chloride: 104 mmol/L (ref 98–111)
Creatinine, Ser: 0.78 mg/dL (ref 0.61–1.24)
GFR calc Af Amer: >60 mL/min (ref >60)
GFR calc non Af Amer: >60 mL/min (ref >60)
Glucose, Bld: 165 mg/dL — ABNORMAL HIGH (ref 70–99)
Potassium: 3.9 mmol/L (ref 3.5–5.1)
Sodium: 141 mmol/L (ref 135–145)
Total Bilirubin: 0.4 mg/dL (ref 0.3–1.2)
Total Protein: 6.3 g/dL — ABNORMAL LOW (ref 6.5–8.1)

## 2019-02-14 LAB — MAGNESIUM: Magnesium: 2.5 mg/dL — ABNORMAL HIGH (ref 1.7–2.4)

## 2019-02-14 LAB — BRAIN NATRIURETIC PEPTIDE: B Natriuretic Peptide: 23.2 pg/mL (ref 0.0–100.0)

## 2019-02-14 LAB — D-DIMER, QUANTITATIVE: D-Dimer, Quant: 0.79 ug/mL-FEU — ABNORMAL HIGH (ref 0.00–0.50)

## 2019-02-14 LAB — C-REACTIVE PROTEIN: CRP: 9.8 mg/dL — ABNORMAL HIGH (ref <1.0)

## 2019-02-14 LAB — FERRITIN: Ferritin: 548 ng/mL — ABNORMAL HIGH (ref 24–336)

## 2019-02-14 LAB — LACTATE DEHYDROGENASE: LDH: 323 U/L — ABNORMAL HIGH (ref 98–192)

## 2019-02-14 MED ORDER — METHYLPREDNISOLONE SODIUM SUCC 40 MG IJ SOLR: 40.0000 mg | Freq: Every day | INTRAMUSCULAR | Status: DC

## 2019-02-14 MED ORDER — DEXAMETHASONE 6 MG PO TABS
6.0000 mg | ORAL_TABLET | Freq: Every day | ORAL | Status: DC
  Administered 2019-02-15 – 2019-02-17 (×3): 6 mg via ORAL
  Filled 2019-02-14 (×4): qty 1

## 2019-02-14 NOTE — Progress Notes (Signed)
PROGRESS NOTE    Evan Freeman  [id number]:[id number] DOB: 1971-02-25 DOA: 02/12/2019 PCP: Colon Branch, MD      Brief Narrative:  Evan Freeman is a 48 y.o. M with HTN who presented with 2 weeks fever, fatigue, diarrhea, body aches.  Tested positive for COVID 1 week PTA, was isolating at home until day of admission, developed SOB.  In ER, SpO2 88% on aoom air.  CXR with bilateral infiltrates.      Assessment & Plan:  Coronavirus pneumonitis with acute hypoxic respiratory failure In setting of ongoing 2020 COVID-19 pandemic.  S/p Actemra 6/15 S/p remdesivir 6/15 start S/p steroids 6/15 start  Inflammatory markers down.  O2 needs still elevated, 3-4L.   -Continue remdesivir -Continue steroids, patient had intolerance to Solu-medrol yesterday, will reduce to dexamethasone 6 mg PO daily  -VTE PPx with Lovenox  -Daily d-dimer, ferritin and CRP  COVID-19 Labs Recent Labs    02/12/19 1212 02/12/19 1415 02/13/19 0428 02/14/19 0450  DDIMER 1.41*  --  1.36* 0.79*  FERRITIN  --  515* 551* 548*  LDH 493*  --  415* 323*  CRP  --  16.3* 17.8* 9.8*      Hypertension -Continue amlodipine      MDM and disposition: The below labs and imaging reports were reviewed and summarized above.  Medication management as above.  The patient was admitted with severe acute hypoxic respiratory failure due to COVID-19.         DVT prophylaxis: Lovenox Code Status: FULL Family Communication: Wife by phone    Consultants:   None  Procedures:   None  Antimicrobials:   Remdesivir 6/15 >>        Subjective: All history collected via telephonic interpreter.  The patient reports no new fever, cough, confusion.  He felt "hot all over" after his Solu-medrol this morning.  No rash.  No vomiting.  Very dyspneic with exertion.  Objective: Vitals:   02/13/19 1555 02/13/19 1919 02/14/19 0349 02/14/19 0727  BP: 129/85 128/84 127/85 122/75  Pulse: 88 88 86 86   Resp: 20  (!) 21 18  Temp: 98.2 F (36.8 C) 98.2 F (36.8 C) 98.8 F (37.1 C) 98.5 F (36.9 C)  TempSrc: Oral Oral Oral Oral  SpO2: 96% 91% 94% 94%  Weight:      Height:        Intake/Output Summary (Last 24 hours) at 02/14/2019 1425 Last data filed at 02/14/2019 0910 Gross per 24 hour  Intake 480 ml  Output -  Net 480 ml   Filed Weights   02/12/19 1749 02/12/19 2058  Weight: 83.9 kg 79.6 kg    Examination: General appearance:  adult male, alert and in no acute distress.   HEENT: Anicteric, conjunctiva pink, lids and lashes normal. No nasal deformity, discharge, epistaxis.  Lips moist.   Skin: Warm and dry.  no jaundice.  No suspicious rashes or lesions. Cardiac: RRR, nl S1-S2, no murmurs appreciated.  Capillary refill is brisk.  JVP normal.  No LE edema.  Radial pulses 2+ and symmetric. Respiratory: Tachypneic dyspneic with exertion, lungs diminished, no rales.    Abdomen: Abdomen soft.  no TTP. No ascites, distension, hepatosplenomegaly.   MSK: No deformities or effusions. Neuro: Awake and alert.  EOMI, moves all extremities. Speech fluent.    Psych: Sensorium intact and responding to questions, attention normal. Affect normal.  Judgment and insight appear normal.        Data Reviewed: I have personally reviewed  following labs and imaging studies:  CBC: Recent Labs  Lab 02/12/19 1212 02/13/19 0428 02/14/19 0450  WBC 6.6 4.3 12.9*  NEUTROABS 5.7  --  11.0*  HGB 13.5 13.7 13.2  HCT 41.1 42.6 39.9  MCV 83.7 84.5 84.9  PLT 315 358 741*   Basic Metabolic Panel: Recent Labs  Lab 02/12/19 1212 02/13/19 0428 02/13/19 0548 02/14/19 0450  NA 135 140  --  141  K 3.8 4.1  --  3.9  CL 100 103  --  104  CO2 24 25  --  26  GLUCOSE 135* 163*  --  165*  BUN 12 18  --  21*  CREATININE 0.92 0.71  --  0.78  CALCIUM 8.2* 8.5*  --  8.8*  MG  --   --  2.3 2.5*   GFR: Estimated Creatinine Clearance: 112 mL/min (by C-G formula based on SCr of 0.78 mg/dL). Liver  Function Tests: Recent Labs  Lab 02/12/19 1212 02/13/19 0428 02/14/19 0450  AST 50* 42* 30  ALT 42 41 38  ALKPHOS 64 67 59  BILITOT 0.7 0.4 0.4  PROT 6.3* 6.5 6.3*  ALBUMIN 2.5* 2.5* 2.5*   No results for input(s): LIPASE, AMYLASE in the last 168 hours. No results for input(s): AMMONIA in the last 168 hours. Coagulation Profile: No results for input(s): INR, PROTIME in the last 168 hours. Cardiac Enzymes: No results for input(s): CKTOTAL, CKMB, CKMBINDEX, TROPONINI in the last 168 hours. BNP (last 3 results) No results for input(s): PROBNP in the last 8760 hours. HbA1C: No results for input(s): HGBA1C in the last 72 hours. CBG: No results for input(s): GLUCAP in the last 168 hours. Lipid Profile: Recent Labs    02/12/19 1212  TRIG 138   Thyroid Function Tests: No results for input(s): TSH, T4TOTAL, FREET4, T3FREE, THYROIDAB in the last 72 hours. Anemia Panel: Recent Labs    02/13/19 0428 02/14/19 0450  FERRITIN 551* 548*   Urine analysis:    Component Value Date/Time   COLORURINE YELLOW 03/28/2009 0940   APPEARANCEUR CLEAR 03/28/2009 0940   LABSPEC 1.028 03/28/2009 0940   PHURINE 7.5 03/28/2009 0940   GLUCOSEU NEGATIVE 03/28/2009 0940   HGBUR NEGATIVE 03/28/2009 0940   BILIRUBINUR NEGATIVE 03/28/2009 0940   KETONESUR TRACE (A) 03/28/2009 0940   PROTEINUR 30 (A) 03/28/2009 0940   UROBILINOGEN 0.2 03/28/2009 0940   NITRITE NEGATIVE 03/28/2009 0940   LEUKOCYTESUR NEGATIVE 03/28/2009 0940   Sepsis Labs: @LABRCNTIP (procalcitonin:4,lacticacidven:4)  ) Recent Results (from the past 240 hour(s))  Blood Culture (routine x 2)     Status: None (Preliminary result)   Collection Time: 02/12/19 12:36 PM   Specimen: BLOOD  Result Value Ref Range Status   Specimen Description BLOOD SITE NOT SPECIFIED  Final   Special Requests   Final    BOTTLES DRAWN AEROBIC AND ANAEROBIC Blood Culture adequate volume   Culture   Final    NO GROWTH 2 DAYS Performed at [facility], [place] [address] [place], [place] [address]    Report Status PENDING  Incomplete  Blood Culture (routine x 2)     Status: None (Preliminary result)   Collection Time: 02/12/19 12:41 PM   Specimen: BLOOD  Result Value Ref Range Status   Specimen Description BLOOD SITE NOT SPECIFIED  Final   Special Requests   Final    BOTTLES DRAWN AEROBIC AND ANAEROBIC Blood Culture adequate volume   Culture   Final    NO GROWTH 2 DAYS  Performed at [facility], [place] [address] [place], [place] [address]    Report Status PENDING  Incomplete         Radiology Studies: No results found.      Scheduled Meds: . amLODipine  10 mg Oral Daily  . enoxaparin (LOVENOX) injection  40 mg Subcutaneous Q24H  . methylPREDNISolone (SOLU-MEDROL) injection  60 mg Intravenous Q12H   Continuous Infusions: . remdesivir 100 mg in NS 250 mL 100 mg (02/13/19 2106)     LOS: 2 days    Time spent: 25 minutes      Edwin Dada, MD [facility] 02/14/2019, 2:25 PM     Please page through [place]:  [email or web address] Password [email or web address] If 7PM-7AM, please contact night-coverage

## 2019-02-15 LAB — COMPREHENSIVE METABOLIC PANEL
ALT: 38 U/L (ref 0–44)
AST: 32 U/L (ref 15–41)
Albumin: 2.4 g/dL — ABNORMAL LOW (ref 3.5–5.0)
Alkaline Phosphatase: 55 U/L (ref 38–126)
Anion gap: 11 (ref 5–15)
BUN: 22 mg/dL — ABNORMAL HIGH (ref 6–20)
CO2: 27 mmol/L (ref 22–32)
Calcium: 8.5 mg/dL — ABNORMAL LOW (ref 8.9–10.3)
Chloride: 105 mmol/L (ref 98–111)
Creatinine, Ser: 0.79 mg/dL (ref 0.61–1.24)
GFR calc Af Amer: >60 mL/min (ref >60)
GFR calc non Af Amer: >60 mL/min (ref >60)
Glucose, Bld: 116 mg/dL — ABNORMAL HIGH (ref 70–99)
Potassium: 4.5 mmol/L (ref 3.5–5.1)
Sodium: 143 mmol/L (ref 135–145)
Total Bilirubin: 0.2 mg/dL — ABNORMAL LOW (ref 0.3–1.2)
Total Protein: 5.8 g/dL — ABNORMAL LOW (ref 6.5–8.1)

## 2019-02-15 LAB — CBC WITH DIFFERENTIAL/PLATELET
Abs Immature Granulocytes: 0.16 10*3/uL — ABNORMAL HIGH (ref 0.00–0.07)
Basophils Absolute: 0 10*3/uL (ref 0.0–0.1)
Basophils Relative: 0 %
Eosinophils Absolute: 0 10*3/uL (ref 0.0–0.5)
Eosinophils Relative: 0 %
HCT: 41 % (ref 39.0–52.0)
Hemoglobin: 12.8 g/dL — ABNORMAL LOW (ref 13.0–17.0)
Immature Granulocytes: 2 %
Lymphocytes Relative: 13 %
Lymphs Abs: 1.5 10*3/uL (ref 0.7–4.0)
MCH: 26.9 pg (ref 26.0–34.0)
MCHC: 31.2 g/dL (ref 30.0–36.0)
MCV: 86.3 fL (ref 80.0–100.0)
Monocytes Absolute: 0.5 10*3/uL (ref 0.1–1.0)
Monocytes Relative: 5 %
Neutro Abs: 8.7 10*3/uL — ABNORMAL HIGH (ref 1.7–7.7)
Neutrophils Relative %: 80 %
Platelets: 527 10*3/uL — ABNORMAL HIGH (ref 150–400)
RBC: 4.75 MIL/uL (ref 4.22–5.81)
RDW: 13.6 % (ref 11.5–15.5)
WBC: 10.9 10*3/uL — ABNORMAL HIGH (ref 4.0–10.5)
nRBC: 0 % (ref 0.0–0.2)

## 2019-02-15 LAB — FERRITIN: Ferritin: 410 ng/mL — ABNORMAL HIGH (ref 24–336)

## 2019-02-15 LAB — MAGNESIUM: Magnesium: 2.3 mg/dL (ref 1.7–2.4)

## 2019-02-15 LAB — BRAIN NATRIURETIC PEPTIDE: B Natriuretic Peptide: 48.1 pg/mL (ref 0.0–100.0)

## 2019-02-15 LAB — LACTATE DEHYDROGENASE: LDH: 326 U/L — ABNORMAL HIGH (ref 98–192)

## 2019-02-15 LAB — C-REACTIVE PROTEIN: CRP: 5 mg/dL — ABNORMAL HIGH (ref <1.0)

## 2019-02-15 LAB — D-DIMER, QUANTITATIVE: D-Dimer, Quant: 1.14 ug/mL-FEU — ABNORMAL HIGH (ref 0.00–0.50)

## 2019-02-15 NOTE — Progress Notes (Signed)
PROGRESS NOTE    Evan Freeman  [id number]:[id number] DOB: [birth year] DOA: 02/12/2019 PCP: Colon Branch, MD      Brief Narrative:  Evan Freeman is a 48 y.o. M with HTN who presented with 2 weeks fever, fatigue, diarrhea, body aches.  Tested positive for COVID 1 week PTA, was isolating at home until day of admission, developed SOB.  In ER, SpO2 88% on aoom air.  CXR with bilateral infiltrates.      Assessment & Plan:  Coronavirus pneumonitis with acute hypoxic respiratory failure In setting of ongoing 2020 COVID-19 pandemic.  S/p Actemra 6/15 S/p remdesivir 6/15 start S/p steroids 6/15 start  CRP trending down.  Dimer/ferritin stable.  O2 needs slightly better this morning.   -Continue remdesivir, day 4 of 5 -Continue dexamethasone   -Continue VTE PPx with Lovenox  -Continue Daily d-dimer, ferritin and CRP  COVID-19 Labs Recent Labs    02/13/19 0428 02/14/19 0450 02/15/19 0300  DDIMER 1.36* 0.79* 1.14*  FERRITIN 551* 548* 410*  LDH 415* 323* 326*  CRP 17.8* 9.8* 5.0*      Hypertension BP normal -Continue amlodipine      MDM and disposition: The below labs and imaging reports reviewed and summarized above.  Medication management as above.  The patient was admitted with severe acute hypoxic respiratory failure due to COVID-19.  He started to have marginal improvement, although he still requires 3 to 4 L of oxygen by nasal cannula.  We will continue remdesivir and continue to wean oxygen.  Hopefully home in 2 to 3 days.          DVT prophylaxis: Lovenox Code Status: FULL Family Communication: Wife by phone with interpreter    Consultants:   None  Procedures:   None  Antimicrobials:   Remdesivir 6/15 >>   Culture data:   6/15 blood culture x2 NGTD      Subjective: All history collected via telephonic interpreter.  The patient reports no new fever, cough, sputum production, confusion.  He feels mildly better.  Very  dyspneic and tired with exertion, but comfortable at rest.  Objective: Vitals:   02/14/19 1557 02/14/19 2007 02/15/19 0415 02/15/19 0800  BP: 120/81 123/79 117/81 119/71  Pulse: 93 73 60 64  Resp: 18 20 (!) 23 20  Temp: 98.4 F (36.9 C) 98.8 F (37.1 C) 98.9 F (37.2 C) 98.6 F (37 C)  TempSrc: Oral Oral Oral Oral  SpO2: 92% 95% 93% 91%  Weight:      Height:        Intake/Output Summary (Last 24 hours) at 02/15/2019 0836 Last data filed at 02/15/2019 0000 Gross per 24 hour  Intake 1920 ml  Output -  Net 1920 ml   Filed Weights   02/12/19 1749 02/12/19 2058  Weight: 83.9 kg 79.6 kg    Examination: General appearance: Male, lying in bed, no acute distress.  Interactive. HEENT: Icteric, conjunctival pink, lids and lashes normal.  No nasal deformity, discharge, or epistaxis.  Lips moist, dentition normal, oropharynx moist, no oral lesions, hearing normal. Skin: Skin warm and dry, no jaundice. Cardiac: Regular rate and rhythm, no murmurs, JVP normal, no lower extremity edema. Respiratory: Tachypneic with exertion, breathing comfortably at rest.  Lungs diminished, no rales, no wheezes.    Abdomen: Abdomen soft without tenderness to palpation, no hepatosplenomegaly or distention.   MSK: No deformities or effusions. Neuro: Wake and alert, extraocular movements intact, moves all extremities, speech fluent.    Psych: Sensorium intact  and responding to questions, attention normal, affect normal, judgment insight appear normal.        Data Reviewed: I have personally reviewed following labs and imaging studies:  CBC: Recent Labs  Lab 02/12/19 1212 02/13/19 0428 02/14/19 0450 02/15/19 0300  WBC 6.6 4.3 12.9* 10.9*  NEUTROABS 5.7  --  11.0* 8.7*  HGB 13.5 13.7 13.2 12.8*  HCT 41.1 42.6 39.9 41.0  MCV 83.7 84.5 84.9 86.3  PLT 315 358 484* 702*   Basic Metabolic Panel: Recent Labs  Lab 02/12/19 1212 02/13/19 0428 02/13/19 0548 02/14/19 0450 02/15/19 0300  NA 135  140  --  141 143  K 3.8 4.1  --  3.9 4.5  CL 100 103  --  104 105  CO2 24 25  --  26 27  GLUCOSE 135* 163*  --  165* 116*  BUN 12 18  --  21* 22*  CREATININE 0.92 0.71  --  0.78 0.79  CALCIUM 8.2* 8.5*  --  8.8* 8.5*  MG  --   --  2.3 2.5* 2.3   GFR: Estimated Creatinine Clearance: 112 mL/min (by C-G formula based on SCr of 0.79 mg/dL). Liver Function Tests: Recent Labs  Lab 02/12/19 1212 02/13/19 0428 02/14/19 0450 02/15/19 0300  AST 50* 42* 30 32  ALT 42 41 38 38  ALKPHOS 64 67 59 55  BILITOT 0.7 0.4 0.4 0.2*  PROT 6.3* 6.5 6.3* 5.8*  ALBUMIN 2.5* 2.5* 2.5* 2.4*   No results for input(s): LIPASE, AMYLASE in the last 168 hours. No results for input(s): AMMONIA in the last 168 hours. Coagulation Profile: No results for input(s): INR, PROTIME in the last 168 hours. Cardiac Enzymes: No results for input(s): CKTOTAL, CKMB, CKMBINDEX, TROPONINI in the last 168 hours. BNP (last 3 results) No results for input(s): PROBNP in the last 8760 hours. HbA1C: No results for input(s): HGBA1C in the last 72 hours. CBG: No results for input(s): GLUCAP in the last 168 hours. Lipid Profile: Recent Labs    02/12/19 1212  TRIG 138   Thyroid Function Tests: No results for input(s): TSH, T4TOTAL, FREET4, T3FREE, THYROIDAB in the last 72 hours. Anemia Panel: Recent Labs    02/14/19 0450 02/15/19 0300  FERRITIN 548* 410*   Urine analysis:    Component Value Date/Time   COLORURINE YELLOW 03/28/2009 0940   APPEARANCEUR CLEAR 03/28/2009 0940   LABSPEC 1.028 03/28/2009 0940   PHURINE 7.5 03/28/2009 0940   GLUCOSEU NEGATIVE 03/28/2009 0940   HGBUR NEGATIVE 03/28/2009 0940   BILIRUBINUR NEGATIVE 03/28/2009 0940   KETONESUR TRACE (A) 03/28/2009 0940   PROTEINUR 30 (A) 03/28/2009 0940   UROBILINOGEN 0.2 03/28/2009 0940   NITRITE NEGATIVE 03/28/2009 0940   LEUKOCYTESUR NEGATIVE 03/28/2009 0940   Sepsis Labs: @LABRCNTIP (procalcitonin:4,lacticacidven:4)  ) Recent Results (from  the past 240 hour(s))  Blood Culture (routine x 2)     Status: None (Preliminary result)   Collection Time: 02/12/19 12:36 PM   Specimen: BLOOD  Result Value Ref Range Status   Specimen Description BLOOD SITE NOT SPECIFIED  Final   Special Requests   Final    BOTTLES DRAWN AEROBIC AND ANAEROBIC Blood Culture adequate volume   Culture   Final    NO GROWTH 2 DAYS Performed at [facility], [place] [address], [place], [place] [address]    Report Status PENDING  Incomplete  Blood Culture (routine x 2)     Status: None (Preliminary result)   Collection Time: 02/12/19 12:41 PM  Specimen: BLOOD  Result Value Ref Range Status   Specimen Description BLOOD SITE NOT SPECIFIED  Final   Special Requests   Final    BOTTLES DRAWN AEROBIC AND ANAEROBIC Blood Culture adequate volume   Culture   Final    NO GROWTH 2 DAYS Performed at [facility], [address], [place], [place] [address]    Report Status PENDING  Incomplete         Radiology Studies: No results found.      Scheduled Meds: . amLODipine  10 mg Oral Daily  . dexamethasone  6 mg Oral Daily  . enoxaparin (LOVENOX) injection  40 mg Subcutaneous Q24H   Continuous Infusions: . remdesivir 100 mg in NS 250 mL Stopped (02/14/19 2210)     LOS: 3 days    Time spent: 25 minutes      Edwin Dada, MD [facility] 02/15/2019, 8:36 AM     Please page through [place]:  [email or web address] Password [email or web address] If 7PM-7AM, please contact night-coverage

## 2019-02-15 NOTE — Progress Notes (Signed)
Called and updated spouse via phone about plan of care. Answered all questions. Will continue to monitor.

## 2019-02-15 NOTE — Plan of Care (Signed)
  Problem: Education: Goal: Knowledge of risk factors and measures for prevention of condition will improve Outcome: Progressing   Problem: Coping: Goal: Psychosocial and spiritual needs will be supported Outcome: Progressing   Problem: Respiratory: Goal: Will maintain a patent airway Outcome: Progressing Goal: Complications related to the disease process, condition or treatment will be avoided or minimized Outcome: Progressing   Problem: Education: Goal: Knowledge of General Education information will improve Description: Including pain rating scale, medication(s)/side effects and non-pharmacologic comfort measures Outcome: Progressing   Problem: Health Behavior/Discharge Planning: Goal: Ability to manage health-related needs will improve Outcome: Progressing   Problem: Clinical Measurements: Goal: Respiratory complications will improve Outcome: Progressing   Problem: Activity: Goal: Risk for activity intolerance will decrease Outcome: Progressing   Problem: Nutrition: Goal: Adequate nutrition will be maintained Outcome: Progressing   Problem: Elimination: Goal: Will not experience complications related to bowel motility Outcome: Progressing Goal: Will not experience complications related to urinary retention Outcome: Progressing   Problem: Safety: Goal: Ability to remain free from injury will improve Outcome: Progressing   Problem: Skin Integrity: Goal: Risk for impaired skin integrity will decrease Outcome: Progressing

## 2019-02-16 ENCOUNTER — Ambulatory Visit: Payer: Self-pay | Admitting: Internal Medicine

## 2019-02-16 LAB — CBC WITH DIFFERENTIAL/PLATELET
Abs Immature Granulocytes: 0.26 10*3/uL — ABNORMAL HIGH (ref 0.00–0.07)
Basophils Absolute: 0.1 10*3/uL (ref 0.0–0.1)
Basophils Relative: 1 %
Eosinophils Absolute: 0 10*3/uL (ref 0.0–0.5)
Eosinophils Relative: 0 %
HCT: 42.7 % (ref 39.0–52.0)
Hemoglobin: 13.9 g/dL (ref 13.0–17.0)
Immature Granulocytes: 2 %
Lymphocytes Relative: 14 %
Lymphs Abs: 1.5 10*3/uL (ref 0.7–4.0)
MCH: 27.8 pg (ref 26.0–34.0)
MCHC: 32.6 g/dL (ref 30.0–36.0)
MCV: 85.4 fL (ref 80.0–100.0)
Monocytes Absolute: 0.5 10*3/uL (ref 0.1–1.0)
Monocytes Relative: 5 %
Neutro Abs: 8.3 10*3/uL — ABNORMAL HIGH (ref 1.7–7.7)
Neutrophils Relative %: 78 %
Platelets: 506 10*3/uL — ABNORMAL HIGH (ref 150–400)
RBC: 5 MIL/uL (ref 4.22–5.81)
RDW: 13.6 % (ref 11.5–15.5)
WBC: 10.7 10*3/uL — ABNORMAL HIGH (ref 4.0–10.5)
nRBC: 0.2 % (ref 0.0–0.2)

## 2019-02-16 LAB — COMPREHENSIVE METABOLIC PANEL
ALT: 90 U/L — ABNORMAL HIGH (ref 0–44)
AST: 60 U/L — ABNORMAL HIGH (ref 15–41)
Albumin: 2.7 g/dL — ABNORMAL LOW (ref 3.5–5.0)
Alkaline Phosphatase: 56 U/L (ref 38–126)
Anion gap: 12 (ref 5–15)
BUN: 20 mg/dL (ref 6–20)
CO2: 24 mmol/L (ref 22–32)
Calcium: 8.3 mg/dL — ABNORMAL LOW (ref 8.9–10.3)
Chloride: 103 mmol/L (ref 98–111)
Creatinine, Ser: 0.72 mg/dL (ref 0.61–1.24)
GFR calc Af Amer: >60 mL/min (ref >60)
GFR calc non Af Amer: >60 mL/min (ref >60)
Glucose, Bld: 113 mg/dL — ABNORMAL HIGH (ref 70–99)
Potassium: 4.6 mmol/L (ref 3.5–5.1)
Sodium: 139 mmol/L (ref 135–145)
Total Bilirubin: 0.2 mg/dL — ABNORMAL LOW (ref 0.3–1.2)
Total Protein: 6 g/dL — ABNORMAL LOW (ref 6.5–8.1)

## 2019-02-16 LAB — BRAIN NATRIURETIC PEPTIDE: B Natriuretic Peptide: 20 pg/mL (ref 0.0–100.0)

## 2019-02-16 LAB — C-REACTIVE PROTEIN: CRP: 2.6 mg/dL — ABNORMAL HIGH (ref <1.0)

## 2019-02-16 LAB — FERRITIN: Ferritin: 323 ng/mL (ref 24–336)

## 2019-02-16 LAB — D-DIMER, QUANTITATIVE: D-Dimer, Quant: 1.23 ug/mL-FEU — ABNORMAL HIGH (ref 0.00–0.50)

## 2019-02-16 LAB — LACTATE DEHYDROGENASE: LDH: 327 U/L — ABNORMAL HIGH (ref 98–192)

## 2019-02-16 LAB — MAGNESIUM: Magnesium: 2.4 mg/dL (ref 1.7–2.4)

## 2019-02-16 MED ORDER — GUAIFENESIN 100 MG/5ML PO SOLN
10.0000 mL | ORAL | Status: DC | PRN
  Administered 2019-02-16 – 2019-02-17 (×4): 200 mg via ORAL
  Filled 2019-02-16 (×4): qty 15

## 2019-02-16 NOTE — Progress Notes (Signed)
PROGRESS NOTE    Evan Freeman  UYQ:[id number] DOB: Aug 01, 1971 DOA: 02/12/2019 PCP: Colon Branch, MD      Brief Narrative:  Evan Freeman is a 48 y.o. M with HTN who presented with 2 weeks fever, fatigue, diarrhea, body aches.  Tested positive for COVID 1 week PTA, was isolating at home until day of admission, developed SOB.  In ER, SpO2 88% on aoom air.  CXR with bilateral infiltrates.      Assessment & Plan:  Coronavirus pneumonitis with acute hypoxic respiratory failure In setting of ongoing 2020 COVID-19 pandemic.  S/p Actemra 6/15 S/p remdesivir 6/15 start S/p steroids 6/15 start  CRP down.    O2 weaned to 2L today. -Continue remdesivir, day 5 of 5  -Continue dexamethasone  -Continue VTE PPx with Lovenox    COVID-19 Labs Recent Labs    02/14/19 0450 02/15/19 0300 02/16/19 0300  DDIMER 0.79* 1.14* 1.23*  FERRITIN 548* 410* 323  LDH 323* 326* 327*  CRP 9.8* 5.0* 2.6*      Hypertension BP controlled -Continue amlodipine      MDM and disposition: The below labs and imaging reports reviewed and summarized above.  Medication management as above.  The patient was admitted with severe acute hypoxic respiratory failure due to COVID-19.  He started to have marginal improvement, although he still requires 3 to 4 L of oxygen by nasal cannula.  We will finish remdesivir and continue to wean oxygen.     Home tomorrow          DVT prophylaxis: Lovenox Code Status: FULL Family Communication:  Wife by phone    Consultants:   None  Procedures:   None  Antimicrobials:   Remdesivir 6/15 >> 6/19  Culture data:   6/15 blood culture x2 NGTD      Subjective: All history collected via telephonic interpreter.  A little bit of cough overnight, improved with Robitussin.  No confusion, fever.  No hemoptysis.  No respiratory distress     Objective: Vitals:   02/15/19 1000 02/15/19 1638 02/15/19 1937 02/16/19 0358  BP:   119/83 125/78 118/80  Pulse: 65 72 81 62  Resp:  16    Temp:  98.4 F (36.9 C) 98.1 F (36.7 C) 98.3 F (36.8 C)  TempSrc:  Oral Oral Oral  SpO2: 94% 94% 94% 95%  Weight:      Height:        Intake/Output Summary (Last 24 hours) at 02/16/2019 0846 Last data filed at 02/15/2019 1845 Gross per 24 hour  Intake 1320 ml  Output -  Net 1320 ml   Filed Weights   02/12/19 1749 02/12/19 2058  Weight: 83.9 kg 79.6 kg    Examination: General appearance: Adult male, sitting in recliner, no acute distress, interactive. HEENT: Anicteric, conjunctival pink, lids and lashes normal.. Skin: Skin warm and dry, no jaundice. Cardiac: Regular rate and rhythm, no murmurs, JVP normal, no lower extremity edema. Respiratory: Somewhat dyspneic with exertion, breathing comfortable at rest, lungs diminished, no rales or wheezes.    Abdomen: Abdomen soft without tenderness to palpation, hepatosplenomegaly, or distention. MSK: No deformities or effusions. Neuro: Awake and alert, extraocular movements intact, moves all extremities with normal strength and coordination is, speech fluent.    Psych: Sensorium intact and responding to questions, attention normal, affect normal, judgment insight appear normal.        Data Reviewed: I have personally reviewed following labs and imaging studies:  CBC: Recent Labs  Lab  02/12/19 1212 02/13/19 0428 02/14/19 0450 02/15/19 0300 02/16/19 0300  WBC 6.6 4.3 12.9* 10.9* 10.7*  NEUTROABS 5.7  --  11.0* 8.7* 8.3*  HGB 13.5 13.7 13.2 12.8* 13.9  HCT 41.1 42.6 39.9 41.0 42.7  MCV 83.7 84.5 84.9 86.3 85.4  PLT 315 358 484* 527* 960*   Basic Metabolic Panel: Recent Labs  Lab 02/12/19 1212 02/13/19 0428 02/13/19 0548 02/14/19 0450 02/15/19 0300 02/16/19 0300  NA 135 140  --  141 143 139  K 3.8 4.1  --  3.9 4.5 4.6  CL 100 103  --  104 105 103  CO2 24 25  --  26 27 24   GLUCOSE 135* 163*  --  165* 116* 113*  BUN 12 18  --  21* 22* 20  CREATININE 0.92  0.71  --  0.78 0.79 0.72  CALCIUM 8.2* 8.5*  --  8.8* 8.5* 8.3*  MG  --   --  2.3 2.5* 2.3 2.4   GFR: Estimated Creatinine Clearance: 112 mL/min (by C-G formula based on SCr of 0.72 mg/dL). Liver Function Tests: Recent Labs  Lab 02/12/19 1212 02/13/19 0428 02/14/19 0450 02/15/19 0300 02/16/19 0300  AST 50* 42* 30 32 60*  ALT 42 41 38 38 90*  ALKPHOS 64 67 59 55 56  BILITOT 0.7 0.4 0.4 0.2* 0.2*  PROT 6.3* 6.5 6.3* 5.8* 6.0*  ALBUMIN 2.5* 2.5* 2.5* 2.4* 2.7*   No results for input(s): LIPASE, AMYLASE in the last 168 hours. No results for input(s): AMMONIA in the last 168 hours. Coagulation Profile: No results for input(s): INR, PROTIME in the last 168 hours. Cardiac Enzymes: No results for input(s): CKTOTAL, CKMB, CKMBINDEX, TROPONINI in the last 168 hours. BNP (last 3 results) No results for input(s): PROBNP in the last 8760 hours. HbA1C: No results for input(s): HGBA1C in the last 72 hours. CBG: No results for input(s): GLUCAP in the last 168 hours. Lipid Profile: No results for input(s): CHOL, HDL, LDLCALC, TRIG, CHOLHDL, LDLDIRECT in the last 72 hours. Thyroid Function Tests: No results for input(s): TSH, T4TOTAL, FREET4, T3FREE, THYROIDAB in the last 72 hours. Anemia Panel: Recent Labs    02/15/19 0300 02/16/19 0300  FERRITIN 410* 323   Urine analysis:    Component Value Date/Time   COLORURINE YELLOW 03/28/2009 0940   APPEARANCEUR CLEAR 03/28/2009 0940   LABSPEC 1.028 03/28/2009 0940   PHURINE 7.5 03/28/2009 0940   GLUCOSEU NEGATIVE 03/28/2009 0940   HGBUR NEGATIVE 03/28/2009 0940   BILIRUBINUR NEGATIVE 03/28/2009 0940   KETONESUR TRACE (A) 03/28/2009 0940   PROTEINUR 30 (A) 03/28/2009 0940   UROBILINOGEN 0.2 03/28/2009 0940   NITRITE NEGATIVE 03/28/2009 0940   LEUKOCYTESUR NEGATIVE 03/28/2009 0940   Sepsis Labs: @LABRCNTIP (procalcitonin:4,lacticacidven:4)  ) Recent Results (from the past 240 hour(s))  Blood Culture (routine x 2)     Status: None  (Preliminary result)   Collection Time: 02/12/19 12:36 PM   Specimen: BLOOD  Result Value Ref Range Status   Specimen Description BLOOD SITE NOT SPECIFIED  Final   Special Requests   Final    BOTTLES DRAWN AEROBIC AND ANAEROBIC Blood Culture adequate volume   Culture   Final    NO GROWTH 4 DAYS Performed at [facility], [place] [address] [place], [place] [address]    Report Status PENDING  Incomplete  Blood Culture (routine x 2)     Status: None (Preliminary result)   Collection Time: 02/12/19 12:41 PM   Specimen: BLOOD  Result  Value Ref Range Status   Specimen Description BLOOD SITE NOT SPECIFIED  Final   Special Requests   Final    BOTTLES DRAWN AEROBIC AND ANAEROBIC Blood Culture adequate volume   Culture   Final    NO GROWTH 4 DAYS Performed at [facility], [address] [place], [place] [address]    Report Status PENDING  Incomplete         Radiology Studies: No results found.      Scheduled Meds: . amLODipine  10 mg Oral Daily  . dexamethasone  6 mg Oral Daily  . enoxaparin (LOVENOX) injection  40 mg Subcutaneous Q24H   Continuous Infusions: . remdesivir 100 mg in NS 250 mL 100 mg (02/15/19 2204)     LOS: 4 days    Time spent: 25 minutes      Edwin Dada, MD [facility] 02/16/2019, 8:46 AM     Please page through [place]:  [email or web address] Password [email or web address] If 7PM-7AM, please contact night-coverage

## 2019-02-16 NOTE — TOC Initial Note (Signed)
Transition of Care [facility]) - Initial/Assessment Note    Patient Details  Name: Evan Freeman MRN: [id number] Date of Birth: 01/09/1971  Transition of Care [facility]) CM/SW Contact:    Midge Minium RN, BSN, NCM-BC, ACM-RN [phone] (working remotely) Phone Number: 02/16/2019, 5:24 PM  Clinical Narrative:                 CM contacted the patient via phone utilizing [employer] (ID# [phone]) to discuss the POC. Patient states living at home with his spouse and being independent with his ADLs PTA. Patient has no insurance, but has an established PCP Dr. Kathlene November. Per Dr. Loleta Books, home oxygen will be needed at discharge. Charity oxygen to be provided by [place]; Learta Codding [facility] liaison) informed of the referral; AVS updated. Patient stated his spouse will be home and available for delivery, and available to provide transportation at discharge. Patient will be provided with a portable oxygen tank, thermometer and pulse ox device prior to him transitioning home. CM team will continue to follow.    Expected Discharge Plan: Home/Self Care Barriers to Discharge: Continued Medical Work up   Patient Goals and CMS Choice Patient states their goals for this hospitalization and ongoing recovery are:: "to return home" CMS Medicare.gov Compare Post Acute Care list provided to:: Patient(Charity oxygen) Choice offered to / list presented to : Patient  Expected Discharge Plan and Services Expected Discharge Plan: Home/Self Care In-house Referral: NA Discharge Planning Services: CM Consult Post Acute Care Choice: Durable Medical Equipment Living arrangements for the past 2 months: Single Family Home                 DME Arranged: Oxygen DME Agency: [place] Date DME Agency Contacted: 02/16/19 Time DME Agency Contacted: [id number] Representative spoke with at DME Agency: Learta Codding Liaison HH Arranged: NA [place] Agency: NA        Prior Living Arrangements/Services Living  arrangements for the past 2 months: [place] with:: Self, Spouse Patient language and need for interpreter reviewed:: Yes Do you feel safe going back to the place where you live?: Yes      Need for Family Participation in Patient Care: Yes (Comment) Care giver support system in place?: Yes (comment)   Criminal Activity/Legal Involvement Pertinent to Current Situation/Hospitalization: No - Comment as needed  Activities of Daily Living Home Assistive Devices/Equipment: None ADL Screening (condition at time of admission) Patient's cognitive ability adequate to safely complete daily activities?: Yes Is the patient deaf or have difficulty hearing?: No Does the patient have difficulty seeing, even when wearing glasses/contacts?: No Does the patient have difficulty concentrating, remembering, or making decisions?: No Patient able to express need for assistance with ADLs?: Yes Does the patient have difficulty dressing or bathing?: No Independently performs ADLs?: Yes (appropriate for developmental age) Does the patient have difficulty walking or climbing stairs?: Yes Weakness of Legs: Both Weakness of Arms/Hands: None  Permission Sought/Granted Permission sought to share information with : Case Manager, Customer service manager Permission granted to share information with : Yes, Verbal Permission Granted  Share Information with NAME: Evan Freeman  Permission granted to share info w AGENCY: [place] granted to share info w Relationship: spouse  Permission granted to share info w Contact Information: [phone]  Emotional Assessment   Attitude/Demeanor/Rapport: Gracious Affect (typically observed): Unable to Assess Orientation: : Oriented to Self, Oriented to Place, Oriented to  Time, Oriented to Situation Alcohol / Substance Use: Not Applicable Psych Involvement: No (  comment)  Admission diagnosis:  COVID-19 [U07.1, J98.8] Patient Active Problem List    Diagnosis Date Noted  . COVID-19 virus infection 02/12/2019  . Essential hypertension 02/12/2019  . Follow-up --------------PCP notes  05/13/2015  . Annual physical exam 05/12/2015  . Gallbladder polyp 08/05/2014  . Abdominal pain, chronic, epigastric 06/14/2014  . Elevated BP   . ABDOMINAL PAIN, EPIGASTRIC 04/30/2009   PCP:  Colon Branch, MD Pharmacy:   [facility] [address] [place]), [place] - [address] [place] ([place]) [place] [address] Phone: [phone] Fax: [phone]     Social Determinants of Health (SDOH) Interventions    Readmission Risk Interventions No flowsheet data found.

## 2019-02-16 NOTE — Plan of Care (Signed)
  Problem: Education: Goal: Knowledge of risk factors and measures for prevention of condition will improve Outcome: Progressing   Problem: Coping: Goal: Psychosocial and spiritual needs will be supported Outcome: Progressing   Problem: Respiratory: Goal: Will maintain a patent airway Outcome: Progressing Goal: Complications related to the disease process, condition or treatment will be avoided or minimized Outcome: Progressing   Problem: Education: Goal: Knowledge of General Education information will improve Description: Including pain rating scale, medication(s)/side effects and non-pharmacologic comfort measures Outcome: Progressing   Problem: Health Behavior/Discharge Planning: Goal: Ability to manage health-related needs will improve Outcome: Progressing   Problem: Clinical Measurements: Goal: Respiratory complications will improve Outcome: Progressing   Problem: Activity: Goal: Risk for activity intolerance will decrease Outcome: Progressing   Problem: Nutrition: Goal: Adequate nutrition will be maintained Outcome: Progressing   Problem: Elimination: Goal: Will not experience complications related to bowel motility Outcome: Progressing Goal: Will not experience complications related to urinary retention Outcome: Progressing   Problem: Safety: Goal: Ability to remain free from injury will improve Outcome: Progressing   Problem: Skin Integrity: Goal: Risk for impaired skin integrity will decrease Outcome: Progressing

## 2019-02-16 NOTE — Progress Notes (Addendum)
0750  Resting in bed.  On 3l/[place].  O2 sat 92%.  Dyspnea with exertion.  Dry cough noted.  Call light in reach.  1329  Sat in chair approximately 2-3 hours.  Ambulated in room.  O2 sats 92-94%.  Returned to bed.  1604  Ambulated approximately 125 ft in hallway.  O2 sat 97% on 3l/[place] prior to walking.  O2 sat dropped to 82% on room air with ambulation.  O2 at 2l/[place] applied.  O2 sat 92%.  At rest O2 sats increased to 97%.  Patient did c/o shortness of breath and coughing while walking.

## 2019-02-17 DIAGNOSIS — J1289 Other viral pneumonia: Secondary | ICD-10-CM

## 2019-02-17 LAB — CBC WITH DIFFERENTIAL/PLATELET
Abs Immature Granulocytes: 0.3 10*3/uL — ABNORMAL HIGH (ref 0.00–0.07)
Basophils Absolute: 0 10*3/uL (ref 0.0–0.1)
Basophils Relative: 0 %
Eosinophils Absolute: 0.1 10*3/uL (ref 0.0–0.5)
Eosinophils Relative: 1 %
HCT: 44.3 % (ref 39.0–52.0)
Hemoglobin: 14.1 g/dL (ref 13.0–17.0)
Immature Granulocytes: 3 %
Lymphocytes Relative: 17 %
Lymphs Abs: 2 10*3/uL (ref 0.7–4.0)
MCH: 27 pg (ref 26.0–34.0)
MCHC: 31.8 g/dL (ref 30.0–36.0)
MCV: 84.9 fL (ref 80.0–100.0)
Monocytes Absolute: 0.6 10*3/uL (ref 0.1–1.0)
Monocytes Relative: 5 %
Neutro Abs: 8.6 10*3/uL — ABNORMAL HIGH (ref 1.7–7.7)
Neutrophils Relative %: 74 %
Platelets: 546 10*3/uL — ABNORMAL HIGH (ref 150–400)
RBC: 5.22 MIL/uL (ref 4.22–5.81)
RDW: 13.3 % (ref 11.5–15.5)
WBC: 11.5 10*3/uL — ABNORMAL HIGH (ref 4.0–10.5)
nRBC: 0 % (ref 0.0–0.2)

## 2019-02-17 LAB — CULTURE, BLOOD (ROUTINE X 2)
Culture: NO GROWTH
Culture: NO GROWTH
Special Requests: ADEQUATE
Special Requests: ADEQUATE

## 2019-02-17 LAB — COMPREHENSIVE METABOLIC PANEL
ALT: 99 U/L — ABNORMAL HIGH (ref 0–44)
AST: 52 U/L — ABNORMAL HIGH (ref 15–41)
Albumin: 2.8 g/dL — ABNORMAL LOW (ref 3.5–5.0)
Alkaline Phosphatase: 54 U/L (ref 38–126)
Anion gap: 9 (ref 5–15)
BUN: 22 mg/dL — ABNORMAL HIGH (ref 6–20)
CO2: 27 mmol/L (ref 22–32)
Calcium: 8.6 mg/dL — ABNORMAL LOW (ref 8.9–10.3)
Chloride: 103 mmol/L (ref 98–111)
Creatinine, Ser: 0.82 mg/dL (ref 0.61–1.24)
GFR calc Af Amer: >60 mL/min (ref >60)
GFR calc non Af Amer: >60 mL/min (ref >60)
Glucose, Bld: 100 mg/dL — ABNORMAL HIGH (ref 70–99)
Potassium: 4.4 mmol/L (ref 3.5–5.1)
Sodium: 139 mmol/L (ref 135–145)
Total Bilirubin: 0.4 mg/dL (ref 0.3–1.2)
Total Protein: 6.2 g/dL — ABNORMAL LOW (ref 6.5–8.1)

## 2019-02-17 LAB — MAGNESIUM: Magnesium: 2.6 mg/dL — ABNORMAL HIGH (ref 1.7–2.4)

## 2019-02-17 MED ORDER — GUAIFENESIN-DM 100-10 MG/5ML PO SYRP: 5.0000 mL | ORAL_SOLUTION | ORAL | 0 refills | Status: DC | PRN

## 2019-02-17 MED ORDER — DEXAMETHASONE 6 MG PO TABS: 6.0000 mg | ORAL_TABLET | Freq: Every day | ORAL | 0 refills | Status: DC

## 2019-02-17 NOTE — Progress Notes (Addendum)
0801  Resting in bed.  O2 sat 95% on 2l/[place].  Dry cough noted.    0925  Ambulated approximately 125 ft in hallway on 2l/.  Initial O2 sat 95%, decreasing to 90% with ambulation.  O2 sat increased to 94% with rest upon returning to room.  Some shortness of breath noted but much better than yesterday.

## 2019-02-17 NOTE — Discharge Summary (Signed)
Physician Discharge Summary  Evan Freeman LGX:[id number] DOB: 1971/07/30 DOA: 02/12/2019  PCP: Evan Branch, MD  Admit date: 02/12/2019 Discharge date: 02/17/2019  Admitted From: Home  Disposition:  Home   Recommendations for Outpatient Follow-up:  1. Follow up with PCP in 1 week by phone 2. Please obtain LFTs in 1 month    Home Health: None  Equipment/Devices: Home O2  Discharge Condition: Good  CODE STATUS: FULL Diet recommendation: Regular  Brief/Interim Summary: Evan Freeman is a 48 y.o. M with HTN who presented with 2 weeks fever, fatigue, diarrhea, body aches.  Tested positive for COVID 1 week PTA, was isolating at home until day of admission, developed SOB.  In ER, SpO2 88% on aoom air.  CXR with bilateral infiltrates.     PRINCIPAL HOSPITAL DIAGNOSIS: COVID-19    Discharge Diagnoses:   Coronavirus pneumonitis with acute hypoxic respiratory failure In setting of ongoing 2020 COVID-19 pandemic.  Admitted and started on steroids and remdesivir.  Completed 5 days remdesivir.  Given Actemra 6/15.  O2 needs improved.    Stable in last 48 hours on 2L O2.  Discharged to complete 10 days steroids with dexamethasone.   Transaminitis Mild.  Possibly due to remdesivir, COVID or underlying NASH.  HIV negative. -Repeat LFTs in 1 month  Hypertension BP controlled            Discharge Instructions  Discharge Instructions    Diet general   Complete by: As directed    Discharge instructions   Complete by: As directed    From Evan Freeman: You were admitted for coronavirus (Also known as COVID-19)  You were treated with steroids and remdesivir.  You completed the course of remdesivir (a medicine to prevent coronavirus from reproducing) You should finish the course of steroids by taking dexamethasone 6 mg once daily in the morning for 4 more days  If you have any lingering cough, you should take the same cough syrup we gave in the  hospital, Robitussin (with the ingredients "GUIAFENESIN" and "DEXTROMETHORPHAN") which is available at the pharmacy, over the counter   Use the oxygen any time your oxygen level is less than 88% Use a pulse oximeter to measure your oxygen level.  If the home health agency doesn't bring you one of these, you can find them at any drug store.  You should remain in self isolation for 10 days from the start of your symptoms (whever that was)   If you have questions about when you may leave your house (and end your self-isolation) please call the [facility] Department.  The [facility] Department should make the final decision about when you are safe to leave the house or return to work.  You have no personal health conditions that require special self-isolation precautions, and so the health department (from the standpoint of COVID) may clear you for work at their sole discretion.   If you have anyone in the home who has NOT had coronavirus:    -do not be in the same room with them until your self isolation is over    -wear a mask and have them wear a mask if you MUST be in the same room    -clean all hard surfaces (counters, doors, tables) twice a day    -use a separate bathroom at all times    Call Evan Freeman on Monday to ask for a telephone follow up appointment in the next week   Increase activity slowly  Complete by: As directed    MyChart COVID-19 home monitoring program   Complete by: Feb 17, 2019    Is the patient willing to use the [place] for home monitoring?: No     Allergies as of 02/17/2019   No Known Allergies     Medication List    TAKE these medications   amLODipine 5 MG tablet Commonly known as: NORVASC Take 1 tablet (5 mg total) by mouth daily.   dexamethasone 6 MG tablet Commonly known as: DECADRON Take 1 tablet (6 mg total) by mouth daily. Start taking on: February 18, 2019   guaiFENesin-dextromethorphan 100-10 MG/5ML  syrup Commonly known as: ROBITUSSIN DM Take 5 mLs by mouth every 4 (four) hours as needed for cough.   ibuprofen 200 MG tablet Commonly known as: ADVIL Take 600 mg by mouth every 8 (eight) hours as needed for fever, headache or mild pain.   vitamin C 500 MG tablet Commonly known as: ASCORBIC ACID Take 500 mg by mouth daily.            Durable Medical Equipment  (From admission, onward)         Start     Ordered   02/17/19 1545  DME Oxygen  Once    Question Answer Comment  Length of Need 6 Months   Mode or (Route) Nasal cannula   Liters per Minute 2   Frequency Continuous (stationary and portable oxygen unit needed)   Oxygen conserving device Yes   Oxygen delivery system Gas      02/17/19 1544         Follow-up [place] Follow up.   Why: home oxygen Contact information: [place] [address] [phone]        Evan Branch, MD Follow up.   Specialty: Internal Medicine Why: Call on Monday to arrange follow up Contact information: [address] [place] [address] [phone]          No Known Allergies  Consultations:  None   Procedures/Studies: Dg Chest Port 1 View  Result Date: 02/12/2019 CLINICAL DATA:  Shortness of breath and fever EXAM: PORTABLE CHEST 1 VIEW COMPARISON:  None. FINDINGS: Bilateral interstitial and patchy alveolar airspace opacities. No pleural effusion or pneumothorax. Normal cardiomediastinal silhouette. No aggressive osseous lesion. IMPRESSION: 1. Bilateral interstitial and patchy alveolar airspace opacities which may reflect mild pulmonary edema versus multilobar pneumonia. Electronically Signed   By: Evan Freeman   On: 02/12/2019 12:39      Subjective: Feels better.  Able to ambulate in the hall without dyspnea, cough, or dizziness.  No chest pain, normal appetite.  Discharge Exam: Vitals:   02/17/19 1300 02/17/19 1627  BP:    Pulse: 63   Resp:     Temp:  98 F (36.7 C)  SpO2: 94%    Vitals:   02/17/19 1133 02/17/19 1135 02/17/19 1300 02/17/19 1627  BP: 98/68 98/68    Pulse:  80 63   Resp:      Temp:    98 F (36.7 C)  TempSrc:    Oral  SpO2:  91% 94%   Weight:      Height:        General: Pt is alert, awake, not in acute distress Cardiovascular: RRR, nl S1-S2, no murmurs appreciated.   No LE edema.   Respiratory: Normal respiratory rate and rhythm.  CTAB without rales or wheezes. Abdominal: Abdomen  soft and non-tender.  No distension or HSM.   Neuro/Psych: Strength symmetric in upper and lower extremities.  Judgment and insight appear normal.   The results of significant diagnostics from this hospitalization (including imaging, microbiology, ancillary and laboratory) are listed below for reference.     Microbiology: Recent Results (from the past 240 hour(s))  Blood Culture (routine x 2)     Status: None   Collection Time: 02/12/19 12:36 PM   Specimen: BLOOD  Result Value Ref Range Status   Specimen Description BLOOD SITE NOT SPECIFIED  Final   Special Requests   Final    BOTTLES DRAWN AEROBIC AND ANAEROBIC Blood Culture adequate volume   Culture   Final    NO GROWTH 5 DAYS Performed at [facility], [address] [place], [place] [address]    Report Status 02/17/2019 FINAL  Final  Blood Culture (routine x 2)     Status: None   Collection Time: 02/12/19 12:41 PM   Specimen: BLOOD  Result Value Ref Range Status   Specimen Description BLOOD SITE NOT SPECIFIED  Final   Special Requests   Final    BOTTLES DRAWN AEROBIC AND ANAEROBIC Blood Culture adequate volume   Culture   Final    NO GROWTH 5 DAYS Performed at [facility], [place] [address] [place], [place] [address]    Report Status 02/17/2019 FINAL  Final     Labs: BNP (last 3 results) Recent Labs    02/14/19 0450 02/15/19 0300 02/16/19 0300  BNP 23.2 48.1 06.2   Basic Metabolic Panel: Recent Labs  Lab 02/13/19 0428  02/13/19 0548 02/14/19 0450 02/15/19 0300 02/16/19 0300 02/17/19 0256  NA 140  --  141 143 139 139  K 4.1  --  3.9 4.5 4.6 4.4  CL 103  --  104 105 103 103  CO2 25  --  26 27 24 27   GLUCOSE 163*  --  165* 116* 113* 100*  BUN 18  --  21* 22* 20 22*  CREATININE 0.71  --  0.78 0.79 0.72 0.82  CALCIUM 8.5*  --  8.8* 8.5* 8.3* 8.6*  MG  --  2.3 2.5* 2.3 2.4 2.6*   Liver Function Tests: Recent Labs  Lab 02/13/19 0428 02/14/19 0450 02/15/19 0300 02/16/19 0300 02/17/19 0256  AST 42* 30 32 60* 52*  ALT 41 38 38 90* 99*  ALKPHOS 67 59 55 56 54  BILITOT 0.4 0.4 0.2* 0.2* 0.4  PROT 6.5 6.3* 5.8* 6.0* 6.2*  ALBUMIN 2.5* 2.5* 2.4* 2.7* 2.8*   No results for input(s): LIPASE, AMYLASE in the last 168 hours. No results for input(s): AMMONIA in the last 168 hours. CBC: Recent Labs  Lab 02/12/19 1212 02/13/19 0428 02/14/19 0450 02/15/19 0300 02/16/19 0300 02/17/19 0256  WBC 6.6 4.3 12.9* 10.9* 10.7* 11.5*  NEUTROABS 5.7  --  11.0* 8.7* 8.3* 8.6*  HGB 13.5 13.7 13.2 12.8* 13.9 14.1  HCT 41.1 42.6 39.9 41.0 42.7 44.3  MCV 83.7 84.5 84.9 86.3 85.4 84.9  PLT 315 358 484* 527* 506* 546*   Cardiac Enzymes: No results for input(s): CKTOTAL, CKMB, CKMBINDEX, TROPONINI in the last 168 hours. BNP: Invalid input(s): POCBNP CBG: No results for input(s): GLUCAP in the last 168 hours. D-Dimer Recent Labs    02/15/19 0300 02/16/19 0300  DDIMER 1.14* 1.23*   Hgb A1c No results for input(s): HGBA1C in the last 72 hours. Lipid Profile No results for input(s): CHOL, HDL, LDLCALC,  TRIG, CHOLHDL, LDLDIRECT in the last 72 hours. Thyroid function studies No results for input(s): TSH, T4TOTAL, T3FREE, THYROIDAB in the last 72 hours.  Invalid input(s): FREET3 Anemia work up Recent Labs    02/15/19 0300 02/16/19 0300  FERRITIN 410* 323   Urinalysis    Component Value Date/Time   COLORURINE YELLOW 03/28/2009 0940   APPEARANCEUR CLEAR 03/28/2009 0940   LABSPEC 1.028 03/28/2009 0940    PHURINE 7.5 03/28/2009 0940   GLUCOSEU NEGATIVE 03/28/2009 0940   HGBUR NEGATIVE 03/28/2009 0940   BILIRUBINUR NEGATIVE 03/28/2009 0940   KETONESUR TRACE (A) 03/28/2009 0940   PROTEINUR 30 (A) 03/28/2009 0940   UROBILINOGEN 0.2 03/28/2009 0940   NITRITE NEGATIVE 03/28/2009 0940   LEUKOCYTESUR NEGATIVE 03/28/2009 0940   Sepsis Labs Invalid input(s): PROCALCITONIN,  WBC,  LACTICIDVEN Microbiology Recent Results (from the past 240 hour(s))  Blood Culture (routine x 2)     Status: None   Collection Time: 02/12/19 12:36 PM   Specimen: BLOOD  Result Value Ref Range Status   Specimen Description BLOOD SITE NOT SPECIFIED  Final   Special Requests   Final    BOTTLES DRAWN AEROBIC AND ANAEROBIC Blood Culture adequate volume   Culture   Final    NO GROWTH 5 DAYS Performed at [facility], [address] [place], [place] [address]    Report Status 02/17/2019 FINAL  Final  Blood Culture (routine x 2)     Status: None   Collection Time: 02/12/19 12:41 PM   Specimen: BLOOD  Result Value Ref Range Status   Specimen Description BLOOD SITE NOT SPECIFIED  Final   Special Requests   Final    BOTTLES DRAWN AEROBIC AND ANAEROBIC Blood Culture adequate volume   Culture   Final    NO GROWTH 5 DAYS Performed at [facility], [place] [address] [place], [place] [address]    Report Status 02/17/2019 FINAL  Final     Time coordinating discharge: 45 minutes      SIGNED:   Edwin Dada, MD  [facility] 02/17/2019, 5:38 PM

## 2019-02-17 NOTE — Plan of Care (Signed)
  Problem: Education: Goal: Knowledge of risk factors and measures for prevention of condition will improve Outcome: Adequate for Discharge   Problem: Coping: Goal: Psychosocial and spiritual needs will be supported Outcome: Adequate for Discharge   Problem: Respiratory: Goal: Will maintain a patent airway Outcome: Adequate for Discharge Goal: Complications related to the disease process, condition or treatment will be avoided or minimized Outcome: Adequate for Discharge   Problem: Education: Goal: Knowledge of General Education information will improve Description: Including pain rating scale, medication(s)/side effects and non-pharmacologic comfort measures Outcome: Adequate for Discharge   Problem: Health Behavior/Discharge Planning: Goal: Ability to manage health-related needs will improve Outcome: Adequate for Discharge   Problem: Clinical Measurements: Goal: Respiratory complications will improve Outcome: Adequate for Discharge   Problem: Activity: Goal: Risk for activity intolerance will decrease Outcome: Adequate for Discharge   Problem: Nutrition: Goal: Adequate nutrition will be maintained Outcome: Adequate for Discharge   Problem: Elimination: Goal: Will not experience complications related to bowel motility Outcome: Adequate for Discharge Goal: Will not experience complications related to urinary retention Outcome: Adequate for Discharge   Problem: Safety: Goal: Ability to remain free from injury will improve Outcome: Adequate for Discharge   Problem: Skin Integrity: Goal: Risk for impaired skin integrity will decrease Outcome: Adequate for Discharge

## 2019-02-17 NOTE — Discharge Instructions (Signed)
Person Under Monitoring Name: Evan Freeman  Location: [place] [address]   CORONAVIRUS DISEASE 2019 (COVID-19) Guidance for Persons Under Investigation You are being tested for the virus that causes coronavirus disease 2019 (COVID-19). Public health actions are necessary to ensure protection of your health and the health of others, and to prevent further spread of infection. COVID-19 is caused by a virus that can cause symptoms, such as fever, cough, and shortness of breath. The primary transmission from person to person is by coughing or sneezing. On September 28, 2018, the [place] announced a [employer] Emergency of International Concern and on September 29, 2018 the U.S. Department of Health and Human Services declared a public health emergency. If the virus that causesCOVID-19 spreads in the community, it could have severe public health consequences.  As a person under investigation for COVID-19, the [place] advises you to adhere to the following guidance until your test results are reported to you. If your test result is positive, you will receive additional information from your provider and your local health department at that time.   Remain at home until you are cleared by your health provider or public health authorities.   Keep a log of visitors to your home using the form provided. Any visitors to your home must be aware of your isolation status.  If you plan to move to a new address or leave the county, notify the local health department in your county.  Call a doctor or seek care if you have an urgent medical need. Before seeking medical care, call ahead and get instructions from the provider before arriving at the medical office, clinic or hospital. Notify them that you are being tested for the virus that causes COVID-19 so arrangements can be made, as  necessary, to prevent transmission to others in the healthcare setting. Next, notify the local health department in your county.  If a medical emergency arises and you need to call 911, inform the first responders that you are being tested for the virus that causes COVID-19. Next, notify the local health department in your county.  Adhere to all guidance set forth by the [place] for [facility] of patients that is based on guidance from the Center for Disease Control and Prevention with suspected or confirmed COVID-19. It is provided with this guidance for Persons Under Investigation.  Your health and the health of our community are our top priorities. Public Health officials remain available to provide assistance and counseling to you about COVID-19 and compliance with this guidance.  Provider: ____________________________________________________________ Date: ______/_____/_________  By signing below, you acknowledge that you have read and agree to comply with this Guidance for Persons Under Investigation. ______________________________________________________________ Date: ______/_____/_________  WHO DO I CALL? You can find a list of local health departments here: [email or web address] Health Department: ____________________________________________________________________ Contact Name: ________________________________________________________________________ Telephone: ___________________________________________________________________________  Marice Potter, [place], Communicable Disease Branch COVID-19 Guidance for Persons Under Investigation November 04, 2018     Person Under Monitoring Name: Evan Freeman  Location: [place] [address]   Infection Prevention Recommendations for Individuals Confirmed to have, or Being Evaluated for, 2019 Novel Coronavirus  (COVID-19) Infection Who Receive Care at Home  Individuals who are confirmed to have, or are being evaluated for, COVID-19 should follow the prevention steps below until a healthcare provider or local or state  health department says they can return to normal activities.  Stay home except to get medical care You should restrict activities outside your home, except for getting medical care. Do not go to work, school, or public areas, and do not use public transportation or taxis.  Call ahead before visiting your doctor Before your medical appointment, call the healthcare provider and tell them that you have, or are being evaluated for, COVID-19 infection. This will help the healthcare providers office take steps to keep other people from getting infected. Ask your healthcare provider to call the local or state health department.  Monitor your symptoms Seek prompt medical attention if your illness is worsening (e.g., difficulty breathing). Before going to your medical appointment, call the healthcare provider and tell them that you have, or are being evaluated for, COVID-19 infection. Ask your healthcare provider to call the local or state health department.  Wear a facemask You should wear a facemask that covers your nose and mouth when you are in the same room with other people and when you visit a healthcare provider. People who live with or visit you should also wear a facemask while they are in the same room with you.  Separate yourself from other people in your home As much as possible, you should stay in a different room from other people in your home. Also, you should use a separate bathroom, if available.  Avoid sharing household items You should not share dishes, drinking glasses, cups, eating utensils, towels, bedding, or other items with other people in your home. After using these items, you should wash them thoroughly with soap and water.  Cover your coughs and  sneezes Cover your mouth and nose with a tissue when you cough or sneeze, or you can cough or sneeze into your sleeve. Throw used tissues in a lined trash can, and immediately wash your hands with soap and water for at least 20 seconds or use an alcohol-based hand rub.  Wash your [facility] your hands often and thoroughly with soap and water for at least 20 seconds. You can use an alcohol-based hand sanitizer if soap and water are not available and if your hands are not visibly dirty. Avoid touching your eyes, nose, and mouth with unwashed hands.   Prevention Steps for Caregivers and Household Members of Individuals Confirmed to have, or Being Evaluated for, COVID-19 Infection Being Cared for in the Home  If you live with, or provide care at home for, a person confirmed to have, or being evaluated for, COVID-19 infection please follow these guidelines to prevent infection:  Follow healthcare providers instructions Make sure that you understand and can help the patient follow any healthcare provider instructions for all care.  Provide for the patients basic needs You should help the patient with basic needs in the home and provide support for getting groceries, prescriptions, and other personal needs.  Monitor the patients symptoms If they are getting sicker, call his or her medical provider and tell them that the patient has, or is being evaluated for, COVID-19 infection. This will help the healthcare providers office take steps to keep other people from getting infected. Ask the healthcare provider to call the local or state health department.  Limit the number of people who have contact with the patient  If possible, have only one caregiver for the patient.  Other household members should stay in another home or place of residence. If this is not possible, they should stay  in another room,  or be separated from the patient as much as possible. Use a separate bathroom, if  available.  Restrict visitors who do not have an essential need to be in the home.  Keep older adults, very young children, and other sick people away from the patient Keep older adults, very young children, and those who have compromised immune systems or chronic health conditions away from the patient. This includes people with chronic heart, lung, or kidney conditions, diabetes, and cancer.  Ensure good ventilation Make sure that shared spaces in the home have good air flow, such as from an air conditioner or an opened window, weather permitting.  Wash your hands often  Wash your hands often and thoroughly with soap and water for at least 20 seconds. You can use an alcohol based hand sanitizer if soap and water are not available and if your hands are not visibly dirty.  Avoid touching your eyes, nose, and mouth with unwashed hands.  Use disposable paper towels to dry your hands. If not available, use dedicated cloth towels and replace them when they become wet.  Wear a facemask and gloves  Wear a disposable facemask at all times in the room and gloves when you touch or have contact with the patients blood, body fluids, and/or secretions or excretions, such as sweat, saliva, sputum, nasal mucus, vomit, urine, or feces.  Ensure the mask fits over your nose and mouth tightly, and do not touch it during use.  Throw out disposable facemasks and gloves after using them. Do not reuse.  Wash your hands immediately after removing your facemask and gloves.  If your personal clothing becomes contaminated, carefully remove clothing and launder. Wash your hands after handling contaminated clothing.  Place all used disposable facemasks, gloves, and other waste in a lined container before disposing them with other household waste.  Remove gloves and wash your hands immediately after handling these items.  Do not share dishes, glasses, or other household items with the patient  Avoid sharing  household items. You should not share dishes, drinking glasses, cups, eating utensils, towels, bedding, or other items with a patient who is confirmed to have, or being evaluated for, COVID-19 infection.  After the person uses these items, you should wash them thoroughly with soap and water.  Wash laundry thoroughly  Immediately remove and wash clothes or bedding that have blood, body fluids, and/or secretions or excretions, such as sweat, saliva, sputum, nasal mucus, vomit, urine, or feces, on them.  Wear gloves when handling laundry from the patient.  Read and follow directions on labels of laundry or clothing items and detergent. In general, wash and dry with the warmest temperatures recommended on the label.  Clean all areas the individual has used often  Clean all touchable surfaces, such as counters, tabletops, doorknobs, bathroom fixtures, toilets, phones, keyboards, tablets, and bedside tables, every day. Also, clean any surfaces that may have blood, body fluids, and/or secretions or excretions on them.  Wear gloves when cleaning surfaces the patient has come in contact with.  Use a diluted bleach solution (e.g., dilute bleach with 1 part bleach and 10 parts water) or a household disinfectant with a label that says EPA-registered for coronaviruses. To make a bleach solution at home, add 1 tablespoon of bleach to 1 quart (4 cups) of water. For a larger supply, add  cup of bleach to 1 gallon (16 cups) of water.  Read labels of cleaning products and follow recommendations provided on product labels. Labels contain instructions  for safe and effective use of the cleaning product including precautions you should take when applying the product, such as wearing gloves or eye protection and making sure you have good ventilation during use of the product.  Remove gloves and wash hands immediately after cleaning.  Monitor yourself for signs and symptoms of illness Caregivers and household  members are considered close contacts, should monitor their health, and will be asked to limit movement outside of the home to the extent possible. Follow the monitoring steps for close contacts listed on the symptom monitoring form.   ? If you have additional questions, contact your local health department or call the epidemiologist on call at [phone] (available 24/7). ? This guidance is subject to change. For the most up-to-date guidance from [facility], please refer to their website: [email or web address]

## 2019-02-19 ENCOUNTER — Telehealth: Payer: Self-pay | Admitting: Internal Medicine

## 2019-02-19 NOTE — Telephone Encounter (Signed)
Patient was diagnosed with Covid while in the hospital. Per Infectious disease note in chart - special precautions must be taken until 03/05/19. Patient insists on seeing dr. Larose Kells in person for his hospital f/u. However, due to our protocols (he still has a cough & the note from infectious disease) I was hesitant to schedule an in office visit. Patient declined virtual visit. Please advise.

## 2019-02-19 NOTE — Telephone Encounter (Signed)
Agree ,  rec virtual visit, I will answer all question and see him in personas soon as our protocol allows

## 2019-02-19 NOTE — Telephone Encounter (Signed)
Please advise 

## 2019-02-19 NOTE — Telephone Encounter (Signed)
Called pt twice today 02-19-2019 to schedule Hospital FU appt, pt does not have VM box set up and could not leave message for pt to call and schedule HFU appt with provider.

## 2019-02-19 NOTE — Telephone Encounter (Signed)
Noted. Thank you Kennyth Lose. Will send to PCP as FYI.

## 2019-02-20 ENCOUNTER — Inpatient Hospital Stay: Payer: Self-pay | Admitting: Internal Medicine

## 2019-02-26 ENCOUNTER — Ambulatory Visit (INDEPENDENT_AMBULATORY_CARE_PROVIDER_SITE_OTHER): Payer: HRSA Program | Admitting: Internal Medicine

## 2019-02-26 ENCOUNTER — Other Ambulatory Visit: Payer: Self-pay

## 2019-02-26 DIAGNOSIS — I10 Essential (primary) hypertension: Secondary | ICD-10-CM | POA: Diagnosis not present

## 2019-02-26 DIAGNOSIS — U071 COVID-19: Secondary | ICD-10-CM | POA: Diagnosis not present

## 2019-02-26 DIAGNOSIS — J988 Other specified respiratory disorders: Secondary | ICD-10-CM

## 2019-02-26 NOTE — Progress Notes (Signed)
Subjective:    Patient ID: Evan Freeman, male    DOB: 11-10-1970, 48 y.o.   MRN: [id number]  DOS:  02/26/2019 Virtual Visit via Video Note  I connected with@ on 02/27/19 at  3:00 PM EDT by a video enabled telemedicine application and verified that I am speaking with the correct person using two identifiers.   THIS ENCOUNTER IS A VIRTUAL VISIT DUE TO COVID-19 - PATIENT WAS NOT SEEN IN THE OFFICE. PATIENT HAS CONSENTED TO VIRTUAL VISIT / TELEMEDICINE VISIT   Location of patient: home  Location of provider: office  I discussed the limitations of evaluation and management by telemedicine and the availability of in person appointments. The patient expressed understanding and agreed to proceed.  History of Present Illness: Hospital follow-up Patient was admitted to the hospital 02/12/2019 with 2-week history of COVID-19 symptoms. he had diarrhea,   headaches, body aches. he tested positive for COVID-19, other family members were also affected and tested positive. While his wife improve rapidly, the patient got sicker and again was admitted to the hospital.  He was discharged home oxygen and told to follow-up with PCP to see if oxygen was still necessary  BP Readings from Last 3 Encounters:  02/17/19 98/68  06/29/17 126/72  06/01/16 120/76     Review of Systems At this point, the patient is doing well. He is at home. Denies fever chills No chest pain no difficulty breathing No difficulty breathing even when he is not wearing his oxygen No nausea, vomiting, diarrhea. Cough stopped 4 days ago.  Past Medical History:  Diagnosis Date  . Abdominal pain, chronic, epigastric 06/14/2014  . Elevated BP   . Gallbladder polyp 08/05/2014  . History of stomach ulcers    EGD 2010  . Hyperlipidemia   . Infectious colitis     Past Surgical History:  Procedure Laterality Date  . NO PAST SURGERIES      Social History   Socioeconomic History  . Marital status: Married   Spouse name: Not on file  . Number of children: 2  . Years of education: Not on file  . Highest education level: Not on file  Occupational History  . Occupation: Insurance underwriter, Agricultural consultant: [employer]  . Financial resource strain: Not on file  . Food insecurity    Worry: Not on file    Inability: Not on file  . Transportation needs    Medical: Not on file    Non-medical: Not on file  Tobacco Use  . Smoking status: Never Smoker  . Smokeless tobacco: Never Used  Substance and Sexual Activity  . Alcohol use: Yes    Alcohol/week: 0.0 standard drinks    Comment: rare  . Drug use: No  . Sexual activity: Not on file  Lifestyle  . Physical activity    Days per week: Not on file    Minutes per session: Not on file  . Stress: Not on file  Relationships  . Social Herbalist on phone: Not on file    Gets together: Not on file    Attends religious service: Not on file    Active member of club or organization: Not on file    Attends meetings of clubs or organizations: Not on file    Relationship status: Not on file  . Intimate partner violence    Fear of current or ex partner: Not on file    Emotionally abused: Not on file  Physically abused: Not on file    Forced sexual activity: Not on file  Other Topics Concern  . Not on file  Social History Narrative   Original from [place]   7 grade education   Household-- pt, wife, daughter 71; son 1998, both live w/ him      Allergies as of 02/26/2019   No Known Allergies     Medication List       Accurate as of February 26, 2019 11:59 PM. If you have any questions, ask your nurse or doctor.        STOP taking these medications   dexamethasone 6 MG tablet Commonly known as: DECADRON Stopped by: Kathlene November, MD     TAKE these medications   amLODipine 5 MG tablet Commonly known as: NORVASC Take 1 tablet (5 mg total) by mouth daily.   guaiFENesin-dextromethorphan 100-10 MG/5ML syrup Commonly known  as: ROBITUSSIN DM Take 5 mLs by mouth every 4 (four) hours as needed for cough.   ibuprofen 200 MG tablet Commonly known as: ADVIL Take 600 mg by mouth every 8 (eight) hours as needed for fever, headache or mild pain.   vitamin C 500 MG tablet Commonly known as: ASCORBIC ACID Take 500 mg by mouth daily.           Objective:   Physical Exam There were no vitals taken for this visit. This is a virtual video visit, he is alert oriented x3, no apparent distress, on oxygen by cannula.    Assessment     Assessment  Dyslipidemia H/o Elevated BP Chronic epigastric pain --EGD C~2010 PUD per Pt --CT abd neg 2010 --EGD 08-2014 Dr Deatra Ina: gastric erosions (bx chronic gastritis HP neg) , Rx PPI x 3 months , then prn Gallbladder polyps per [place] 06-2014,saw GI, incidental finding >>>> [place] again 3-17, stable , next 1 year   Fatty liver per [place] 06-2014, normal LFTs  PLAN: COVID-19:   Patient was admitted to the hospital with COVID-19, was treated with steroids, remdesivir x5 days on Actemra 02/12/2019. Oxygenation improved but he was still hypoxic prior to discharge and he was sent home on oxygen by cannula. LFTs were elevated, possibly from remdesivir At this point, he is doing better, plan: Nurse visit this week We will get a CMP, CBC, check vital signs and O2 sat. we will also get a chest x-ray further advised with results HTN: Currently on amlodipine, BPs at the hospital were satisfactory.         I discussed the assessment and treatment plan with the patient. The patient was provided an opportunity to ask questions and all were answered. The patient agreed with the plan and demonstrated an understanding of the instructions.   The patient was advised to call back or seek an in-person evaluation if the symptoms worsen or if the condition fails to improve as anticipated.

## 2019-02-27 NOTE — Assessment & Plan Note (Signed)
COVID-19:   Patient was admitted to the hospital with COVID-19, was treated with steroids, remdesivir x5 days on Actemra 02/12/2019. Oxygenation improved but he was still hypoxic prior to discharge and he was sent home on oxygen by cannula. LFTs were elevated, possibly from remdesivir At this point, he is doing better, plan: Nurse visit this week We will get a CMP, CBC, check vital signs and O2 sat. we will also get a chest x-ray further advised with results HTN: Currently on amlodipine, BPs at the hospital were satisfactory.

## 2019-03-01 ENCOUNTER — Other Ambulatory Visit: Payer: Self-pay

## 2019-03-05 ENCOUNTER — Ambulatory Visit (INDEPENDENT_AMBULATORY_CARE_PROVIDER_SITE_OTHER): Payer: HRSA Program | Admitting: Internal Medicine

## 2019-03-05 ENCOUNTER — Ambulatory Visit (HOSPITAL_BASED_OUTPATIENT_CLINIC_OR_DEPARTMENT_OTHER)
Admission: RE | Admit: 2019-03-05 | Discharge: 2019-03-05 | Disposition: A | Discharge status: Home / Self Care | Payer: HRSA Program | Source: Ambulatory Visit | Attending: Internal Medicine | Admitting: Internal Medicine

## 2019-03-05 ENCOUNTER — Encounter: Payer: Self-pay | Admitting: Internal Medicine

## 2019-03-05 ENCOUNTER — Other Ambulatory Visit: Payer: Self-pay

## 2019-03-05 VITALS — BP 123/75 | HR 69 | Temp 98.1°F | Resp 18 | Ht 64.0 in | Wt 175.4 lb

## 2019-03-05 DIAGNOSIS — I10 Essential (primary) hypertension: Secondary | ICD-10-CM

## 2019-03-05 DIAGNOSIS — U071 COVID-19: Secondary | ICD-10-CM | POA: Insufficient documentation

## 2019-03-05 NOTE — Progress Notes (Signed)
Pre visit review using our clinic review tool, if applicable. No additional management support is needed unless otherwise documented below in the visit note.  Pt here today at the request of Dr. Larose Kells. He had a virtual visit on 02/26/2019 after being dx with COVID 19 01/2019. Dr. Larose Kells requested vital signs and weight check.  Wt: 175lb6oz  BP in L arm: 123/75 Pulse: 69 O2 w/ 2 L O2: 100%  After 5 minutes w/o O2 supplementation- O2 is 95%. With walking w/o O2: 94%.`  Per Dr. Larose Kells oxygen no longer needed- go to lab and complete chest x-ray.    Patient was seen 02/26/2019, clinically much improved, vital signs stable, O2 sat much improved, okay to stop oxygen. Kathlene November, MD

## 2019-03-06 ENCOUNTER — Telehealth: Payer: Self-pay | Admitting: Internal Medicine

## 2019-03-06 NOTE — Telephone Encounter (Signed)
Fax confirmation received. 

## 2019-03-06 NOTE — Telephone Encounter (Signed)
Have tried calling [employer] 3 times- have been hung up on or disconnect all 3 times after > 5 minute hold time. Will try calling again later.

## 2019-03-06 NOTE — Telephone Encounter (Signed)
Spoke w/ Larkin Ina at [place]- to d/c oxygen orders- request needs to be faxed to local office at [phone]. D/c orders faxed.

## 2019-03-06 NOTE — Telephone Encounter (Signed)
Pt called stating Dr Larose Kells recommended to take off oxygen tank and to call for company to come pick up Oxygen tank, company stated that provider is needing to call at [facility] [phone] for them to have orders from provider to pick up at pt's address the oxygen tank. If any question please call pt at [phone].

## 2019-03-08 NOTE — Addendum Note (Signed)
Addended byDamita Dunnings D on: 03/08/2019 04:22 PM   Modules accepted: Orders

## 2020-01-04 ENCOUNTER — Ambulatory Visit (INDEPENDENT_AMBULATORY_CARE_PROVIDER_SITE_OTHER): Payer: 59 | Admitting: Internal Medicine

## 2020-01-04 ENCOUNTER — Other Ambulatory Visit: Payer: Self-pay

## 2020-01-04 ENCOUNTER — Encounter: Payer: Self-pay | Admitting: Internal Medicine

## 2020-01-04 VITALS — BP 163/98 | HR 58 | Temp 98.0°F | Resp 16 | Ht 64.0 in | Wt 184.4 lb

## 2020-01-04 DIAGNOSIS — K824 Cholesterolosis of gallbladder: Secondary | ICD-10-CM

## 2020-01-04 DIAGNOSIS — I10 Essential (primary) hypertension: Secondary | ICD-10-CM

## 2020-01-04 DIAGNOSIS — Z0000 Encounter for general adult medical examination without abnormal findings: Secondary | ICD-10-CM | POA: Diagnosis not present

## 2020-01-04 DIAGNOSIS — R739 Hyperglycemia, unspecified: Secondary | ICD-10-CM

## 2020-01-04 LAB — CBC WITH DIFFERENTIAL/PLATELET
Basophils Absolute: 0 10*3/uL (ref 0.0–0.1)
Basophils Relative: 0.5 % (ref 0.0–3.0)
Eosinophils Absolute: 0.3 10*3/uL (ref 0.0–0.7)
Eosinophils Relative: 3 % (ref 0.0–5.0)
HCT: 44 % (ref 39.0–52.0)
Hemoglobin: 14.7 g/dL (ref 13.0–17.0)
Lymphocytes Relative: 33.7 % (ref 12.0–46.0)
Lymphs Abs: 2.9 10*3/uL (ref 0.7–4.0)
MCHC: 33.4 g/dL (ref 30.0–36.0)
MCV: 84.1 fl (ref 78.0–100.0)
Monocytes Absolute: 0.6 10*3/uL (ref 0.1–1.0)
Monocytes Relative: 7.1 % (ref 3.0–12.0)
Neutro Abs: 4.8 10*3/uL (ref 1.4–7.7)
Neutrophils Relative %: 55.7 % (ref 43.0–77.0)
Platelets: 203 10*3/uL (ref 150.0–400.0)
RBC: 5.23 Mil/uL (ref 4.22–5.81)
RDW: 13.9 % (ref 11.5–15.5)
WBC: 8.6 10*3/uL (ref 4.0–10.5)

## 2020-01-04 LAB — COMPREHENSIVE METABOLIC PANEL
ALT: 28 U/L (ref 0–53)
AST: 20 U/L (ref 0–37)
Albumin: 4.5 g/dL (ref 3.5–5.2)
Alkaline Phosphatase: 64 U/L (ref 39–117)
BUN: 18 mg/dL (ref 6–23)
CO2: 30 mEq/L (ref 19–32)
Calcium: 9.2 mg/dL (ref 8.4–10.5)
Chloride: 104 mEq/L (ref 96–112)
Creatinine, Ser: 0.86 mg/dL (ref 0.40–1.50)
GFR: 94.29 mL/min (ref >60.00)
Glucose, Bld: 100 mg/dL — ABNORMAL HIGH (ref 70–99)
Potassium: 3.9 mEq/L (ref 3.5–5.1)
Sodium: 140 mEq/L (ref 135–145)
Total Bilirubin: 0.7 mg/dL (ref 0.2–1.2)
Total Protein: 6.9 g/dL (ref 6.0–8.3)

## 2020-01-04 LAB — LIPID PANEL
Cholesterol: 207 mg/dL — ABNORMAL HIGH (ref 0–200)
HDL: 36.6 mg/dL — ABNORMAL LOW (ref >39.00)
LDL Cholesterol: 136 mg/dL — ABNORMAL HIGH (ref 0–99)
NonHDL: 170.65
Total CHOL/HDL Ratio: 6
Triglycerides: 173 mg/dL — ABNORMAL HIGH (ref 0.0–149.0)
VLDL: 34.6 mg/dL (ref 0.0–40.0)

## 2020-01-04 LAB — TSH: TSH: 0.92 u[IU]/mL (ref 0.35–4.50)

## 2020-01-04 LAB — HEMOGLOBIN A1C: Hgb A1c MFr Bld: 6 % (ref 4.6–6.5)

## 2020-01-04 MED ORDER — AMLODIPINE BESYLATE 5 MG PO TABS: 5.0000 mg | ORAL_TABLET | Freq: Every day | ORAL | 1 refills | Status: DC

## 2020-01-04 NOTE — Progress Notes (Signed)
Pre visit review using our clinic review tool, if applicable. No additional management support is needed unless otherwise documented below in the visit note. 

## 2020-01-04 NOTE — Patient Instructions (Addendum)
COVID-19 Vaccine Information can be found at: [email or web address] For questions related to vaccine distribution or appointments, please email [email or web address] or call [phone].   TOMESE LA PRESION UNA VEZ X SEMANA BP GOAL is between 110/65 and  135/85. If it is consistently higher or lower, let me know  TOME AMLODIPINE 5 MG UNA PASTILLA TODOS LOS DIAS     GO TO THE FRONT DESK, PLEASE SCHEDULE YOUR APPOINTMENTS Come back for   FOR A CHECK UP IN 3 MONTHS    HAGA UNA CITA PARA EL ULTRASONIDO DE EL ESTOMAGO EN EL PRIMER PISO   PONGASE VOLTAREN GEL OVER THE COUNTER 3 VECES AL DIA EN EL CODO [place] SI TIENE DOLOR

## 2020-01-04 NOTE — Progress Notes (Signed)
Subjective:    Patient ID: Evan Freeman, male    DOB: [birth year], 49 y.o.   MRN: [id number]  DOS:  01/04/2020 Type of visit - description: CPX In general feeling well.  Has from time to time right elbow pain associated with numbness and tingling at the right hand, symptoms are sometimes worse at night or when he is working. No neck or wrist pain  Review of Systems  Other than above, a 14 point review of systems is negative    Past Medical History:  Diagnosis Date  . Abdominal pain, chronic, epigastric 06/14/2014  . Elevated BP   . Gallbladder polyp 08/05/2014  . History of stomach ulcers    EGD 2010  . Hyperlipidemia   . Infectious colitis     Past Surgical History:  Procedure Laterality Date  . NO PAST SURGERIES     Family History  Problem Relation Age of Onset  . Gastric cancer Other        father, brother and cousin  . CAD Mother   . Hypertension Mother   . Stomach cancer Father   . Stomach cancer Brother   . Prostate cancer Neg Hx   . Colon cancer Neg Hx   . Diabetes Neg Hx   . Esophageal cancer Neg Hx      Allergies as of 01/04/2020   No Known Allergies     Medication List       Accurate as of Jan 04, 2020 11:59 PM. If you have any questions, ask your nurse or doctor.        STOP taking these medications   guaiFENesin-dextromethorphan 100-10 MG/5ML syrup Commonly known as: ROBITUSSIN DM Stopped by: Kathlene November, MD   ibuprofen 200 MG tablet Commonly known as: ADVIL Stopped by: Kathlene November, MD   vitamin C 500 MG tablet Commonly known as: ASCORBIC ACID Stopped by: Kathlene November, MD     TAKE these medications   amLODipine 5 MG tablet Commonly known as: NORVASC Take 1 tablet (5 mg total) by mouth daily.          Objective:   Physical Exam BP (!) 163/98 (BP Location: Right Arm, Patient Position: Sitting, Cuff Size: Small)   Pulse (!) 58   Temp 98 F (36.7 C) (Temporal)   Resp 16   Ht 5\' 4"  (1.626 m)   Wt 184 lb 6 oz (83.6 kg)   SpO2  100%   BMI 31.65 kg/m  General: Well developed, NAD, BMI noted Neck: No  thyromegaly  HEENT:  Normocephalic . Face symmetric, atraumatic Lungs:  CTA B Normal respiratory effort, no intercostal retractions, no accessory muscle use. Heart: RRR,  no murmur.  Abdomen:  Not distended, soft, non-tender. No rebound or rigidity.   Lower extremities: no pretibial edema bilaterally Upper extremities: Normal to inspection and palpation. Skin: Exposed areas without rash. Not pale. Not jaundice Neurologic:  alert & oriented X3.  Speech normal, gait appropriate for age and unassisted Strength symmetric and appropriate for age. Motor and DTR symmetric Psych: Cognition and judgment appear intact.  Cooperative with normal attention span and concentration.  Behavior appropriate. No anxious or depressed appearing.     Assessment     Assessment  Dyslipidemia HTN (previously elevated BP, started amlodipine ~ 01/2019) Chronic epigastric pain --EGD C~2010 PUD per Pt --CT abd neg 2010 --EGD 08-2014 Dr Deatra Ina: gastric erosions (bx chronic gastritis HP neg) , Rx PPI x 3 months , then prn Gallbladder polyps per [place] 06-2014,saw GI, incidental  finding >>>> [place] again 3-17, [place] 08/2017= stable , next 1 year   Fatty liver per [place] 06-2014, normal LFTs COVID-19, admitted to the hospital, ~ 01/2019   PLAN: Here for CPX History of Covid: Had a post Covid follow-up on 02-2019,   blood work not done, chest x-ray showed bilateral interstitial prominence but improvement. Dyslipidemia: Diet controlled, labs. HTN: Took amlodipine for 6 months and then stopped, did not know it is a permanent medication.  RF sent, RTC 3 months to check BPs.  He now is aware that this will be a lifelong treatment Gallbladder polyps: Check RUQ ultrasound Right elbow pain, paresthesias right hand: Likely cubital tunnel syndrome.  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 Over-the-counter Voltaren gel as needed for the elbow. RTC 3 months     This visit occurred during the SARS-CoV-2 public health emergency.  Safety protocols were in place, including screening questions prior to the visit, additional usage of staff PPE, and extensive cleaning of exam room while observing appropriate contact time as indicated for disinfecting solutions.

## 2020-01-05 NOTE — Assessment & Plan Note (Signed)
-  Td 2013 - covid vaccine pro>cons d/w pt -Colon cancer screening: No FH, I fob provided -Prostate cancer screening: no FH, start at age 50 -Diet and exercise discussed -Labs:  CMP, FLP, CBC, A1c, TSH

## 2020-01-05 NOTE — Assessment & Plan Note (Signed)
Here for CPX History of Covid: Had a post Covid follow-up on 02-2019,   blood work not done, chest x-ray showed bilateral interstitial prominence but improvement. Dyslipidemia: Diet controlled, labs. HTN: Took amlodipine for 6 months and then stopped, did not know it is a permanent medication.  RF sent, RTC 3 months to check BPs.  He now is aware that this will be a lifelong treatment Gallbladder polyps: Check RUQ ultrasound Right elbow pain, paresthesias right hand: Likely cubital tunnel syndrome.  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 Over-the-counter Voltaren gel as needed for the elbow. RTC 3 months

## 2020-01-07 ENCOUNTER — Encounter: Payer: Self-pay | Admitting: Internal Medicine

## 2020-01-11 ENCOUNTER — Other Ambulatory Visit (INDEPENDENT_AMBULATORY_CARE_PROVIDER_SITE_OTHER): Payer: 59

## 2020-01-11 ENCOUNTER — Other Ambulatory Visit: Payer: Self-pay

## 2020-01-11 ENCOUNTER — Ambulatory Visit (HOSPITAL_BASED_OUTPATIENT_CLINIC_OR_DEPARTMENT_OTHER)
Admission: RE | Admit: 2020-01-11 | Discharge: 2020-01-11 | Disposition: A | Discharge status: Home / Self Care | Payer: 59 | Source: Ambulatory Visit | Attending: Internal Medicine | Admitting: Internal Medicine

## 2020-01-11 DIAGNOSIS — Z0000 Encounter for general adult medical examination without abnormal findings: Secondary | ICD-10-CM | POA: Diagnosis not present

## 2020-01-11 DIAGNOSIS — K824 Cholesterolosis of gallbladder: Secondary | ICD-10-CM | POA: Diagnosis not present

## 2020-01-11 LAB — FECAL OCCULT BLOOD, IMMUNOCHEMICAL: Fecal Occult Bld: NEGATIVE

## 2020-01-15 NOTE — Addendum Note (Signed)
Addended byDamita Dunnings D on: 01/15/2020 01:14 PM   Modules accepted: Orders

## 2020-04-11 ENCOUNTER — Ambulatory Visit: Payer: 59 | Admitting: Internal Medicine

## 2020-04-11 ENCOUNTER — Other Ambulatory Visit: Payer: Self-pay

## 2020-04-11 VITALS — BP 149/94 | HR 65 | Temp 98.1°F | Resp 16 | Ht 64.0 in | Wt 185.0 lb

## 2020-04-11 DIAGNOSIS — I10 Essential (primary) hypertension: Secondary | ICD-10-CM | POA: Diagnosis not present

## 2020-04-11 DIAGNOSIS — K824 Cholesterolosis of gallbladder: Secondary | ICD-10-CM | POA: Diagnosis not present

## 2020-04-11 MED ORDER — AMLODIPINE BESYLATE 5 MG PO TABS: 5.0000 mg | ORAL_TABLET | Freq: Every day | ORAL | 3 refills | Status: DC

## 2020-04-11 MED ORDER — KETOCONAZOLE 2 % EX CREA: 1.0000 "application " | TOPICAL_CREAM | Freq: Every day | CUTANEOUS | 0 refills | Status: DC

## 2020-04-11 NOTE — Patient Instructions (Addendum)
Check the  blood pressure  Monthly  BP GOAL is between 110/65 and  135/85. If it is consistently higher or lower, let me know  LLAME PARA REFILLS DE LA PASTILLA PARA LA PRESION CUANDO NECESITE       GO TO THE FRONT DESK, PLEASE SCHEDULE YOUR APPOINTMENTS Come back for a physical exam by 12/2020    Fabiola Backer REFIRIENDO AL DR [facility]  Address: [address], [place], [place] [address] Phone: [phone]

## 2020-04-11 NOTE — Progress Notes (Signed)
Subjective:    Patient ID: Evan Freeman, male    DOB: 01-27-1971, 49 y.o.   MRN: [id number]  DOS:  04/11/2020 Type of visit - description: Follow-up HTN: No ambulatory BPs, good med compliance. Also concerned about his wife: She had frequent yeast infections (vaginal), Kazumi is asymptomatic but would like treatment.  Review of Systems See above   Past Medical History:  Diagnosis Date  . Abdominal pain, chronic, epigastric 06/14/2014  . Elevated BP   . Gallbladder polyp 08/05/2014  . History of stomach ulcers    EGD 2010  . Hyperlipidemia   . Infectious colitis     Past Surgical History:  Procedure Laterality Date  . NO PAST SURGERIES      Allergies as of 04/11/2020   No Known Allergies     Medication List       Accurate as of April 11, 2020  8:58 AM. If you have any questions, ask your nurse or doctor.        amLODipine 5 MG tablet Commonly known as: NORVASC Take 1 tablet (5 mg total) by mouth daily.          Objective:   Physical Exam BP (!) 149/94 (BP Location: Left Arm, Patient Position: Sitting, Cuff Size: Large)   Pulse 65   Temp 98.1 F (36.7 C) (Oral)   Resp 16   Ht 5\' 4"  (1.626 m)   Wt 185 lb (83.9 kg)   SpO2 97%   BMI 31.76 kg/m   General:   Well developed, NAD, BMI noted. HEENT:  Normocephalic . Face symmetric, atraumatic Lungs:  CTA B Normal respiratory effort, no intercostal retractions, no accessory muscle use. Heart: RRR,  no murmur.  Lower extremities: no pretibial edema bilaterally  Skin: Not pale. Not jaundice GU: Penis uncircumcised, healthy skin on foreskin.  No lesions. Neurologic:  alert & oriented X3.  Speech normal, gait appropriate for age and unassisted Psych--  Cognition and judgment appear intact.  Cooperative with normal attention span and concentration.  Behavior appropriate. No anxious or depressed appearing.      Assessment     Assessment  Dyslipidemia HTN (previously elevated BP, started  amlodipine ~ 01/2019) Chronic epigastric pain --EGD C~2010 PUD per Pt --CT abd neg 2010 --EGD 08-2014 Dr Deatra Ina: gastric erosions (bx chronic gastritis HP neg) , Rx PPI x 3 months , then prn Gallbladder polyps per [place] 06-2014,saw GI, incidental finding >>>> [place] again 3-17, [place] 08/2017= stable , next 1 year   Fatty liver per [place] 06-2014, normal LFTs COVID-19, admitted to the hospital, ~ 01/2019   PLAN: Dyslipidemia: 10-year CV RF around 8%.  Diet controlled. HTN: No ambulatory BPs, I rechecked a BP manually today: 135/80.  Refill amlodipine, encouraged to check BPs.  See AVS. Gallbladder ultrasound: Persistent gallbladder polyps, one was 9 x 5 x 5 mm, size increasing, was referred to surgery, states he never got a phone call, will refer him again, rec to contact surgery himself or call this office if needed. Yeast infection?  Patient's wife having regular vaginal chest infections, patient exam is normal but he has strongly request treatment, ketoconazole cream sent twice a day for 1 week. Right elbow pain, paresthesias right hand: see last OV, sx resolved Call instructions discussed in Spanish RTC CPX 9 months    This visit occurred during the SARS-CoV-2 public health emergency.  Safety protocols were in place, including screening questions prior to the visit, additional usage of staff PPE, and extensive cleaning of  exam room while observing appropriate contact time as indicated for disinfecting solutions.

## 2020-04-13 NOTE — Assessment & Plan Note (Signed)
Dyslipidemia: 10-year CV RF around 8%.  Diet controlled. HTN: No ambulatory BPs, I rechecked a BP manually today: 135/80.  Refill amlodipine, encouraged to check BPs.  See AVS. Gallbladder ultrasound: Persistent gallbladder polyps, one was 9 x 5 x 5 mm, size increasing, was referred to surgery, states he never got a phone call, will refer him again, rec to contact surgery himself or call this office if needed. Yeast infection?  Patient's wife having regular vaginal chest infections, patient exam is normal but he has strongly request treatment, ketoconazole cream sent twice a day for 1 week. Right elbow pain, paresthesias right hand: see last OV, sx resolved Call instructions discussed in Spanish RTC CPX 9 months

## 2020-04-19 IMAGING — DX PORTABLE CHEST - 1 VIEW
1 series · 1 of 1 positions shown · non-contrast
Comparison: None.

CLINICAL DATA: Shortness of breath and fever

EXAM:
PORTABLE CHEST 1 VIEW

[chest]
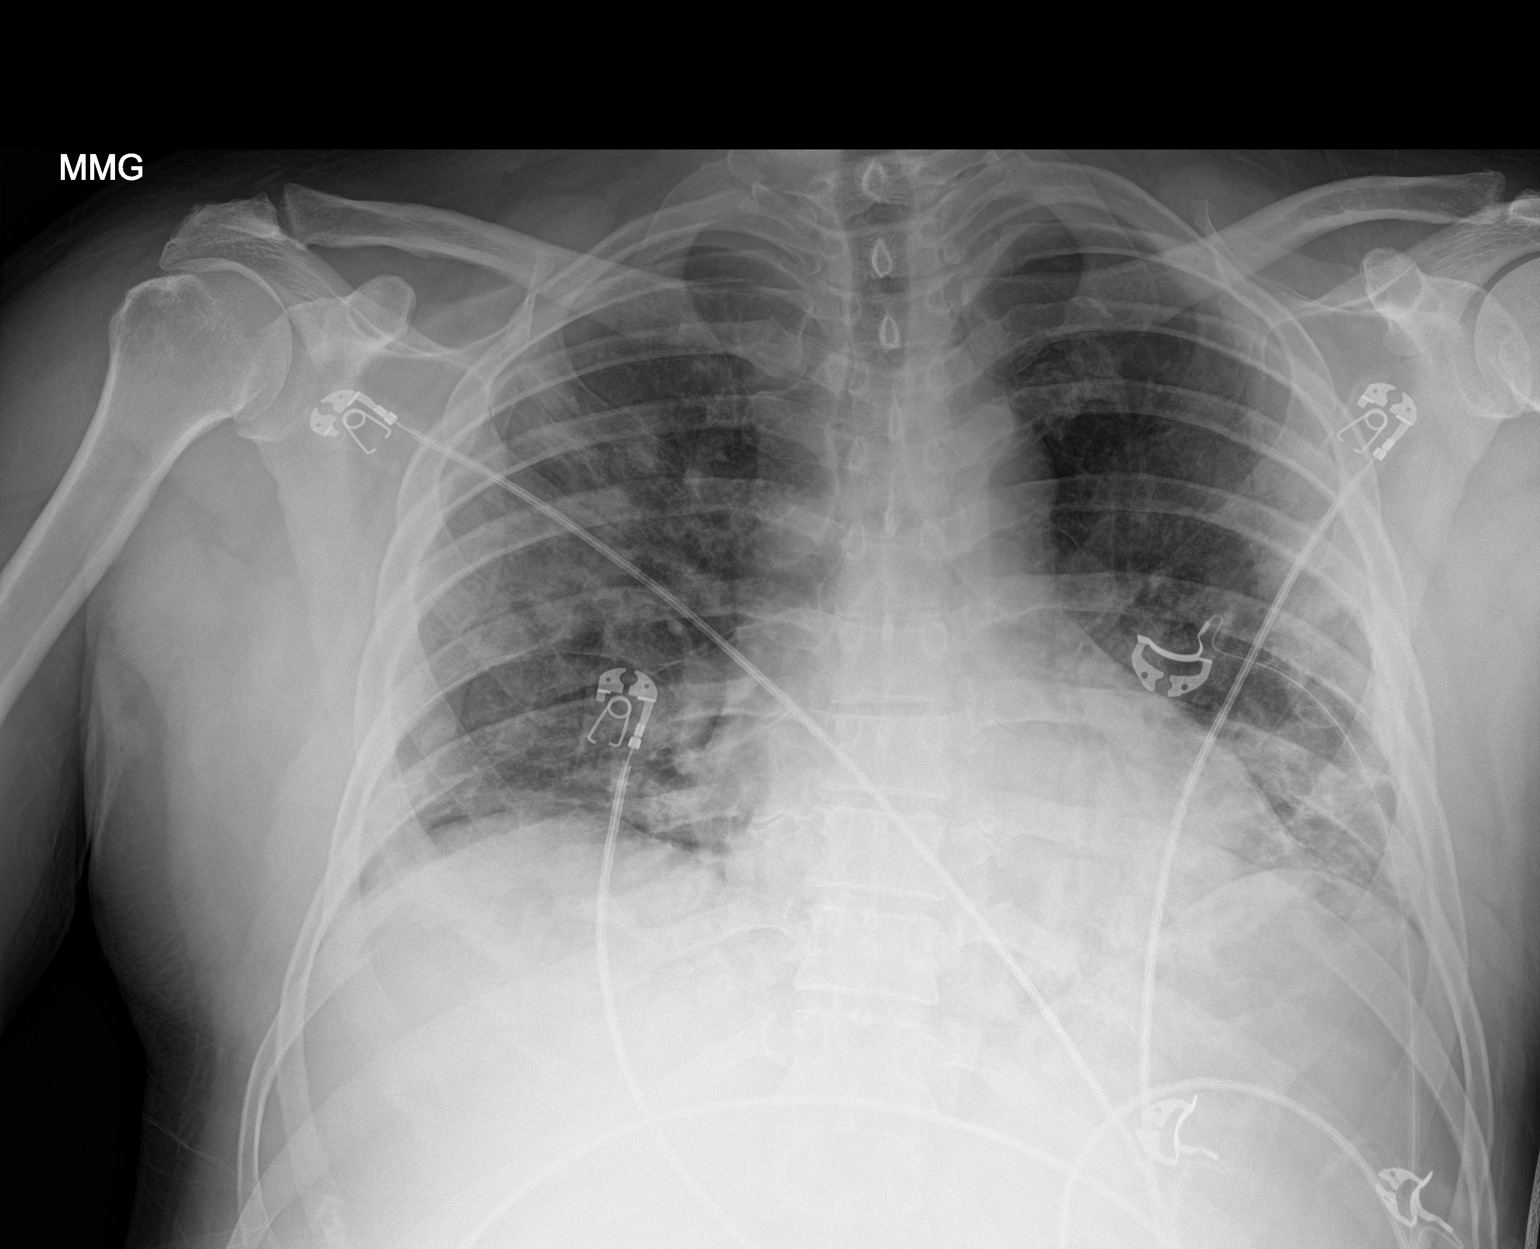

[1 of 1 positions shown; findings below may reference images not displayed]

FINDINGS: Bilateral interstitial and patchy alveolar airspace opacities. No
pleural effusion or pneumothorax. Normal cardiomediastinal
silhouette. No aggressive osseous lesion.
IMPRESSION: 1. Bilateral interstitial and patchy alveolar airspace opacities
which may reflect mild pulmonary edema versus multilobar pneumonia.

## 2021-01-09 ENCOUNTER — Encounter: Payer: 59 | Admitting: Internal Medicine

## 2021-01-23 ENCOUNTER — Other Ambulatory Visit: Payer: Self-pay

## 2021-01-23 ENCOUNTER — Ambulatory Visit (INDEPENDENT_AMBULATORY_CARE_PROVIDER_SITE_OTHER): Payer: 59 | Admitting: Internal Medicine

## 2021-01-23 ENCOUNTER — Encounter: Payer: Self-pay | Admitting: Internal Medicine

## 2021-01-23 VITALS — BP 162/103 | HR 96 | Temp 97.3°F | Ht 64.0 in | Wt 184.4 lb

## 2021-01-23 DIAGNOSIS — Z1211 Encounter for screening for malignant neoplasm of colon: Secondary | ICD-10-CM

## 2021-01-23 DIAGNOSIS — Z125 Encounter for screening for malignant neoplasm of prostate: Secondary | ICD-10-CM | POA: Diagnosis not present

## 2021-01-23 DIAGNOSIS — K824 Cholesterolosis of gallbladder: Secondary | ICD-10-CM

## 2021-01-23 DIAGNOSIS — R739 Hyperglycemia, unspecified: Secondary | ICD-10-CM

## 2021-01-23 DIAGNOSIS — I10 Essential (primary) hypertension: Secondary | ICD-10-CM

## 2021-01-23 DIAGNOSIS — Z1159 Encounter for screening for other viral diseases: Secondary | ICD-10-CM | POA: Diagnosis not present

## 2021-01-23 DIAGNOSIS — Z0000 Encounter for general adult medical examination without abnormal findings: Secondary | ICD-10-CM

## 2021-01-23 LAB — CBC WITH DIFFERENTIAL/PLATELET
Basophils Absolute: 0 10*3/uL (ref 0.0–0.1)
Basophils Relative: 0.4 % (ref 0.0–3.0)
Eosinophils Absolute: 0.3 10*3/uL (ref 0.0–0.7)
Eosinophils Relative: 3.6 % (ref 0.0–5.0)
HCT: 44.2 % (ref 39.0–52.0)
Hemoglobin: 14.6 g/dL (ref 13.0–17.0)
Lymphocytes Relative: 38.5 % (ref 12.0–46.0)
Lymphs Abs: 3.2 10*3/uL (ref 0.7–4.0)
MCHC: 32.9 g/dL (ref 30.0–36.0)
MCV: 84.2 fl (ref 78.0–100.0)
Monocytes Absolute: 0.6 10*3/uL (ref 0.1–1.0)
Monocytes Relative: 7.1 % (ref 3.0–12.0)
Neutro Abs: 4.2 10*3/uL (ref 1.4–7.7)
Neutrophils Relative %: 50.4 % (ref 43.0–77.0)
Platelets: 171 10*3/uL (ref 150.0–400.0)
RBC: 5.26 Mil/uL (ref 4.22–5.81)
RDW: 14.4 % (ref 11.5–15.5)
WBC: 8.4 10*3/uL (ref 4.0–10.5)

## 2021-01-23 LAB — LIPID PANEL
Cholesterol: 182 mg/dL (ref 0–200)
HDL: 32 mg/dL — ABNORMAL LOW (ref >39.00)
NonHDL: 150.14
Total CHOL/HDL Ratio: 6
Triglycerides: 208 mg/dL — ABNORMAL HIGH (ref 0.0–149.0)
VLDL: 41.6 mg/dL — ABNORMAL HIGH (ref 0.0–40.0)

## 2021-01-23 LAB — COMPREHENSIVE METABOLIC PANEL
ALT: 24 U/L (ref 0–53)
AST: 18 U/L (ref 0–37)
Albumin: 4.4 g/dL (ref 3.5–5.2)
Alkaline Phosphatase: 59 U/L (ref 39–117)
BUN: 13 mg/dL (ref 6–23)
CO2: 29 mEq/L (ref 19–32)
Calcium: 9 mg/dL (ref 8.4–10.5)
Chloride: 102 mEq/L (ref 96–112)
Creatinine, Ser: 0.78 mg/dL (ref 0.40–1.50)
GFR: 103.88 mL/min (ref >60.00)
Glucose, Bld: 93 mg/dL (ref 70–99)
Potassium: 3.8 mEq/L (ref 3.5–5.1)
Sodium: 140 mEq/L (ref 135–145)
Total Bilirubin: 0.8 mg/dL (ref 0.2–1.2)
Total Protein: 6.8 g/dL (ref 6.0–8.3)

## 2021-01-23 LAB — PSA: PSA: 0.75 ng/mL (ref 0.10–4.00)

## 2021-01-23 LAB — HEMOGLOBIN A1C: Hgb A1c MFr Bld: 6.1 % (ref 4.6–6.5)

## 2021-01-23 LAB — TSH: TSH: 0.92 u[IU]/mL (ref 0.35–4.50)

## 2021-01-23 LAB — LDL CHOLESTEROL, DIRECT: Direct LDL: 109 mg/dL

## 2021-01-23 MED ORDER — AMLODIPINE BESYLATE 5 MG PO TABS: 5.0000 mg | ORAL_TABLET | Freq: Every day | ORAL | 3 refills | Status: DC

## 2021-01-23 NOTE — Progress Notes (Signed)
Subjective:    Patient ID: Evan Freeman, male    DOB: Apr 27, 1971, 50 y.o.   MRN: [id number]  DOS:  01/23/2021 Type of visit - description: CPX Since the last office visit he is feeling well. Has no major concerns He did mention that for the last few weeks his taste has changed, unable to taste sweets.  No olfatory problems. No recent fever chills  Review of Systems  Other than above, a 14 point review of systems is negative      Past Medical History:  Diagnosis Date  . Abdominal pain, chronic, epigastric 06/14/2014  . Elevated BP   . Gallbladder polyp 08/05/2014  . History of stomach ulcers    EGD 2010  . Hyperlipidemia   . Infectious colitis     Past Surgical History:  Procedure Laterality Date  . NO PAST SURGERIES     Social History   Socioeconomic History  . Marital status: Married    Spouse name: Not on file  . Number of children: 2  . Years of education: Not on file  . Highest education level: Not on file  Occupational History  . Occupation: Insurance underwriter, Agricultural consultant: OTHER  Tobacco Use  . Smoking status: Never Smoker  . Smokeless tobacco: Never Used  Substance and Sexual Activity  . Alcohol use: Yes    Alcohol/week: 0.0 standard drinks    Comment: rare  . Drug use: No  . Sexual activity: Not on file  Other Topics Concern  . Not on file  Social History Narrative   Original from [place]   7 grade education   Household-- pt, wife, and daughter   daughter 106; son 90, both live w/ him   Social Determinants of Health   Financial Resource Strain: Not on file  Food Insecurity: Not on file  Transportation Needs: Not on file  Physical Activity: Not on file  Stress: Not on file  Social Connections: Not on file  Intimate Partner Violence: Not on file    Allergies as of 01/23/2021   No Known Allergies     Medication List       Accurate as of Jan 23, 2021 11:59 PM. If you have any questions, ask your nurse or doctor.         STOP taking these medications   ketoconazole 2 % cream Commonly known as: NIZORAL Stopped by: Kathlene November, MD     TAKE these medications   amLODipine 5 MG tablet Commonly known as: NORVASC Take 1 tablet (5 mg total) by mouth daily.          Objective:   Physical Exam BP (!) 162/103 (BP Location: Left Arm, Patient Position: Sitting, Cuff Size: Large)   Pulse 96   Temp (!) 97.3 F (36.3 C) (Temporal)   Ht 5\' 4"  (1.626 m)   Wt 184 lb 6.4 oz (83.6 kg)   SpO2 98%   BMI 31.65 kg/m  General: Well developed, NAD, BMI noted Neck: No  thyromegaly  HEENT:  Normocephalic . Face symmetric, atraumatic Lungs:  CTA B Normal respiratory effort, no intercostal retractions, no accessory muscle use. Heart: RRR,  no murmur.  Abdomen:  Not distended, soft, non-tender. No rebound or rigidity.   Lower extremities: no pretibial edema bilaterally DRE: Normal sphincter tone, no stools, prostate normal Skin: Exposed areas without rash. Not pale. Not jaundice Neurologic:  alert & oriented X3.  Speech normal, gait appropriate for age and unassisted Strength symmetric and appropriate for age.  Psych: Cognition and judgment appear intact.  Cooperative with normal attention span and concentration.  Behavior appropriate. No anxious or depressed appearing.     Assessment     Assessment  Dyslipidemia HTN (previously elevated BP, started amlodipine ~ 01/2019) Chronic epigastric pain --EGD C~2010 PUD per Pt --CT abd neg 2010 --EGD 08-2014 Dr Deatra Ina: gastric erosions (bx chronic gastritis HP neg) , Rx PPI x 3 months , then prn Gallbladder polyps per [place] 06-2014,saw GI, incidental finding >>>> [place] again 3-17, [place] 08/2017= stable , next 1 year   Fatty liver per [place] 06-2014, normal LFTs COVID-19, admitted to the hospital, ~ 01/2019   PLAN: Here for CPX Dyslipidemia: Diet controlled, check labs. HTN: Reports good med compliance, does not check BPs frequently, did not BP meds this morning, upon  arrival BP was 162/103, recheck 150/90. Plan: Continue amlodipine 5 mg, nurse visit in 4 weeks for BP check. Gallbladder polyps: See last office visit, he never got to see surgery.  We will recheck ultrasound. RTC 1 month BP check RTC 6 months routine visit  This visit occurred during the SARS-CoV-2 public health emergency.  Safety protocols were in place, including screening questions prior to the visit, additional usage of staff PPE, and extensive cleaning of exam room while observing appropriate contact time as indicated for disinfecting solutions.

## 2021-01-23 NOTE — Patient Instructions (Addendum)
Take amlodipine every day  Check the  blood pressure 2 or 3 times a week.  BP GOAL is between 110/65 and  135/85. If it is consistently higher or lower, let me know    GO TO THE LAB : Get the blood work     [place], [place] back for   a nurse visit in 1 month to check your blood pressure  Come back for a checkup in 6 months to see me   STOP BY THE FIRST FLOOR: Schedule the ultrasound

## 2021-01-24 ENCOUNTER — Encounter: Payer: Self-pay | Admitting: Internal Medicine

## 2021-01-24 NOTE — Assessment & Plan Note (Signed)
-  Td 2013 - Shingrix: Discussed - [place] x2, recommend booster -Colon cancer screening: No FH, I fob - 12/2019.  Elected to repeat IFob -Prostate cancer screening: no FH, DRE normal, checking a PSA -He is very active at work, room for improvement on diet, counseled  -Labs: CMP, FLP, CBC, A1c, PSA, hep C

## 2021-01-24 NOTE — Assessment & Plan Note (Signed)
Here for CPX Dyslipidemia: Diet controlled, check labs. HTN: Reports good med compliance, does not check BPs frequently, did not BP meds this morning, upon arrival BP was 162/103, recheck 150/90. Plan: Continue amlodipine 5 mg, nurse visit in 4 weeks for BP check. Gallbladder polyps: See last office visit, he never got to see surgery.  We will recheck ultrasound. RTC 1 month BP check RTC 6 months routine visit

## 2021-01-27 LAB — HEPATITIS C ANTIBODY
Hepatitis C Ab: NONREACTIVE
SIGNAL TO CUT-OFF: 0.01 (ref <1.00)

## 2021-02-06 ENCOUNTER — Other Ambulatory Visit: Payer: Self-pay

## 2021-02-06 ENCOUNTER — Ambulatory Visit (HOSPITAL_BASED_OUTPATIENT_CLINIC_OR_DEPARTMENT_OTHER)
Admission: RE | Admit: 2021-02-06 | Discharge: 2021-02-06 | Disposition: A | Discharge status: Home / Self Care | Payer: BC Managed Care – PPO | Source: Ambulatory Visit | Attending: Internal Medicine | Admitting: Internal Medicine

## 2021-02-06 DIAGNOSIS — K824 Cholesterolosis of gallbladder: Secondary | ICD-10-CM

## 2021-02-20 ENCOUNTER — Ambulatory Visit: Payer: 59

## 2021-03-18 IMAGING — US US ABDOMEN LIMITED
1 series · 13 of 25 positions shown · non-contrast
Comparison: 09/01/2017

CLINICAL DATA: Gallbladder polyp, follow-up

EXAM:
ULTRASOUND ABDOMEN LIMITED RIGHT UPPER QUADRANT

[Series 1: us abdomen limited · 13 of 76 slices shown]
[im 1/76]
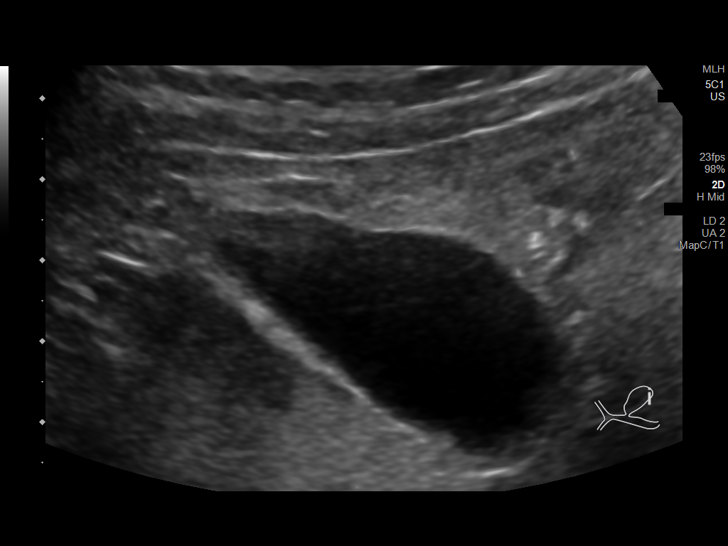
[im 7/76]
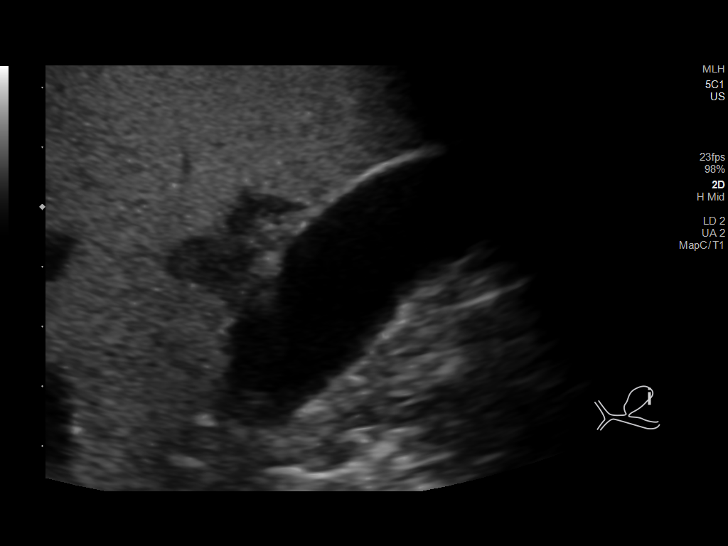
[im 13/76]
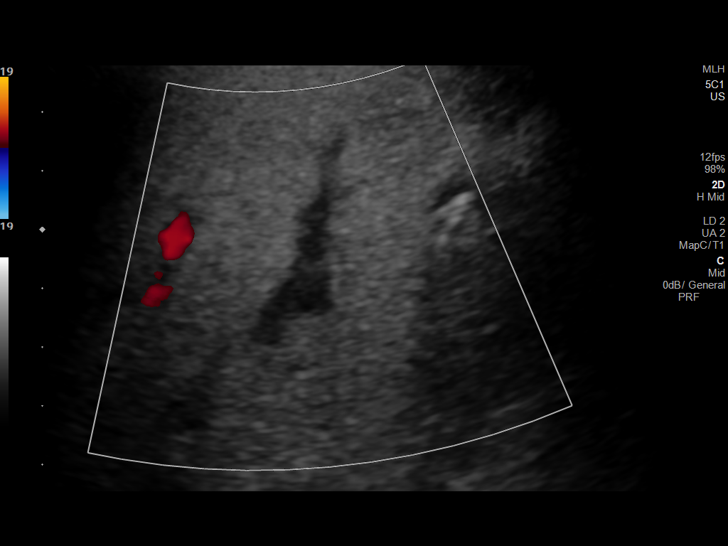
[im 19/76]
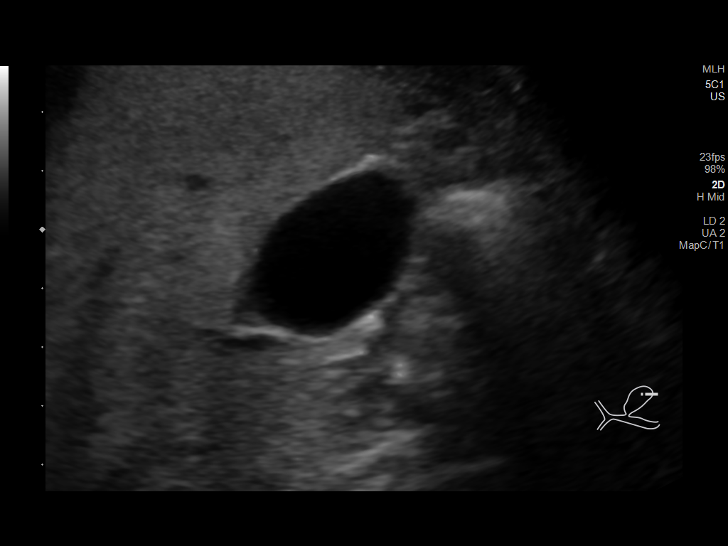
[im 26/76]
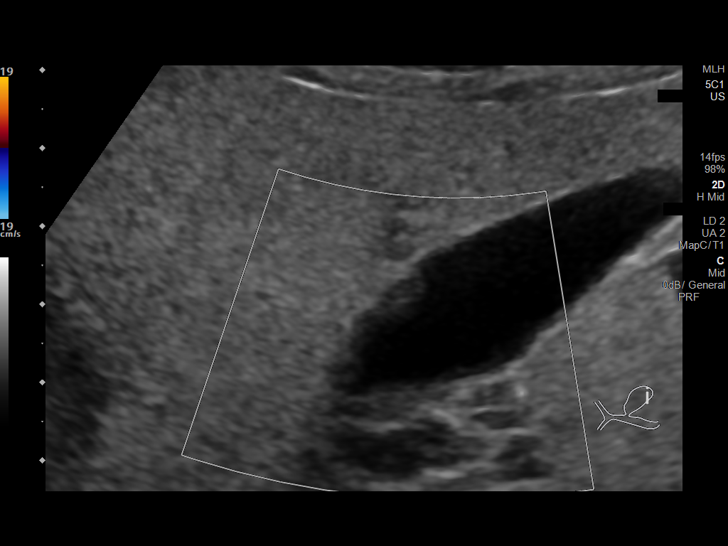
[im 32/76]
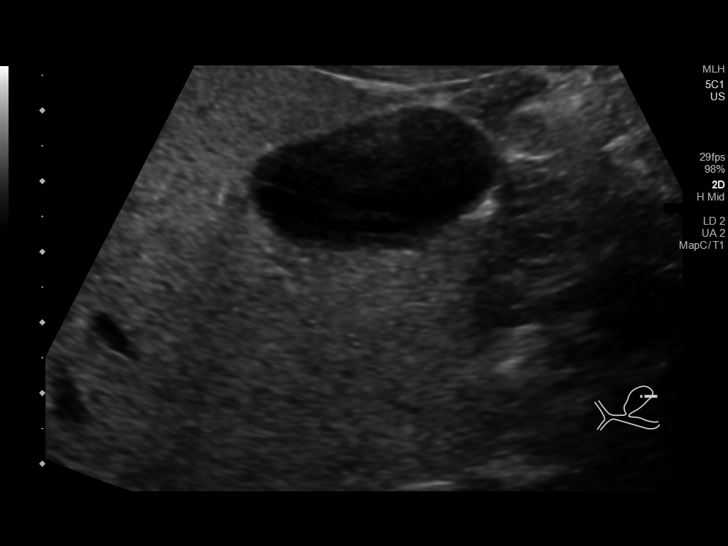
[im 38/76]
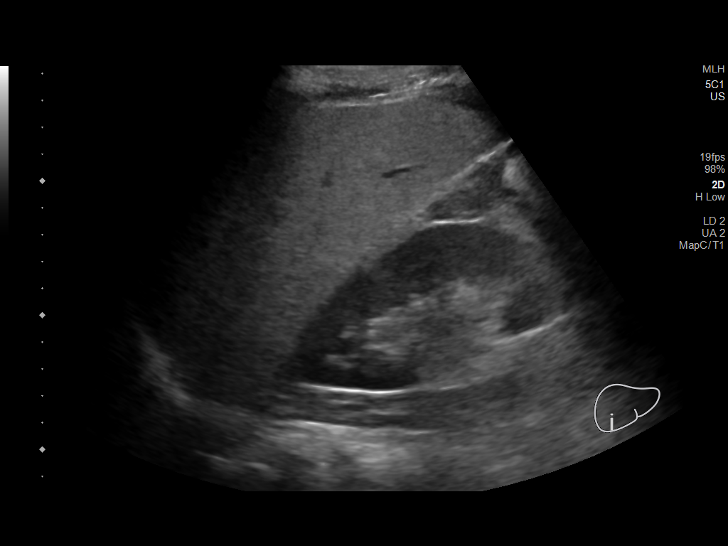
[im 44/76]
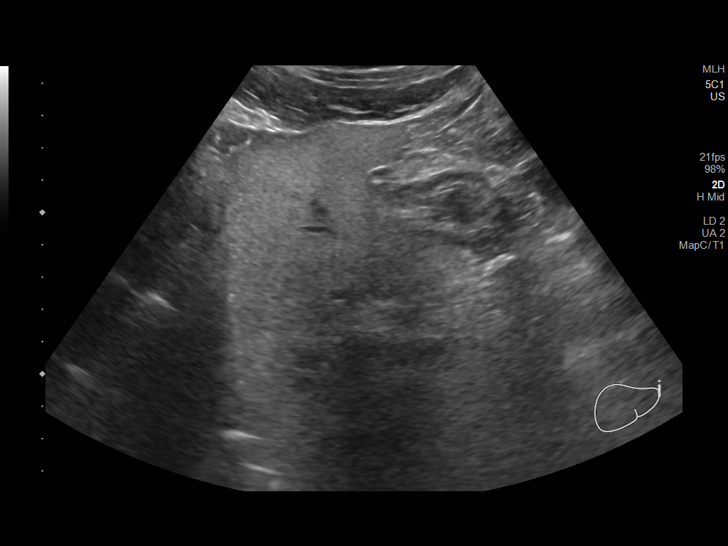
[im 51/76]
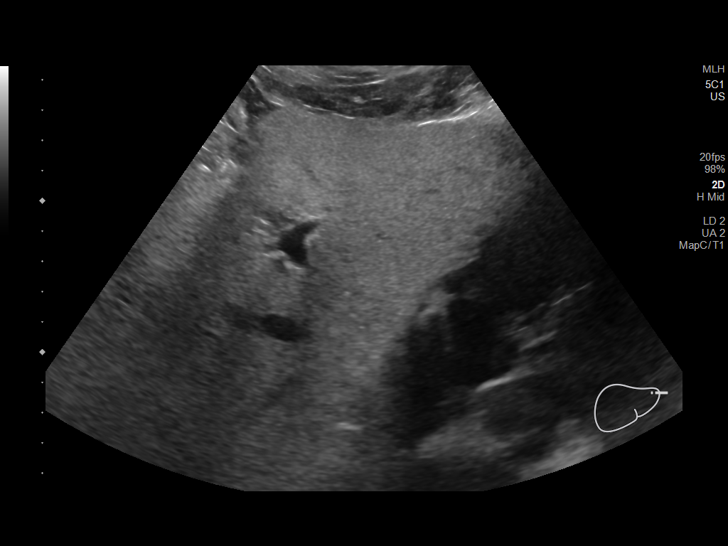
[im 57/76]
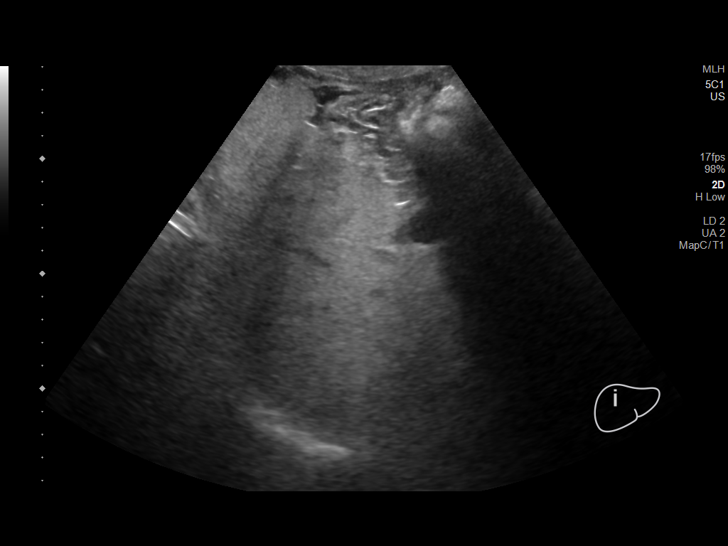
[im 63/76]
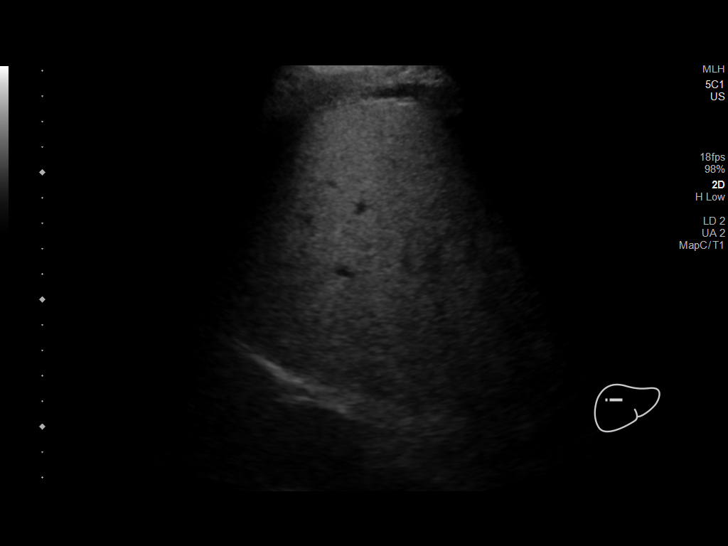
[im 69/76]
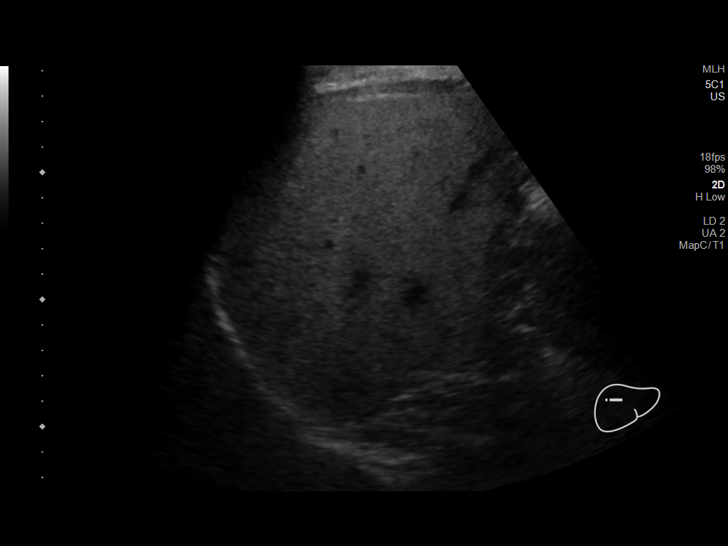
[im 76/76]
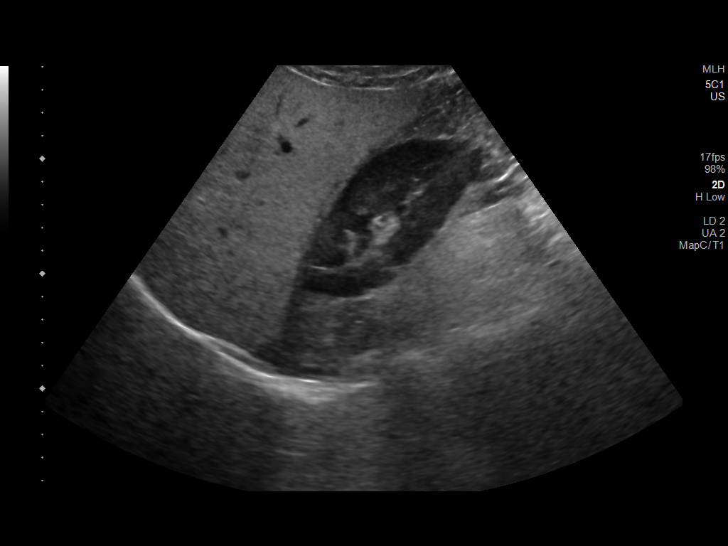

[13 of 25 positions shown; findings below may reference images not displayed]

FINDINGS: Gallbladder:

Normally distended. 9 x 5 x 5 mm polyp identified upon non dependent
gallbladder wall. Previously this measured 8 x 7 x 4 mm. No
gallbladder wall thickening, pericholecystic fluid or sonographic
Murphy sign.

Common bile duct:

Diameter: 5 mm, normal

Liver:

Echogenic parenchyma, likely fatty infiltration though this can be
seen with cirrhosis and certain infiltrative disorders. Probable
focal sparing adjacent to gallbladder fossa similar to prior study.
Portal vein is patent on color Doppler imaging with normal direction
of blood flow towards the liver.

Other: No RIGHT upper quadrant free fluid.
IMPRESSION: Gallbladder polyp 9 x 5 x 5 mm little changed since 8 x 7 x 4 mm on
previous study; annual follow-up imaging in 1 year recommended to
demonstrate long-term stability.

This recommendation follows ACR consensus guidelines: White Paper of
the ACR Incidental Findings Committee II on Gallbladder and Biliary
Findings. [HOSPITAL] 2931:;[DATE].

Probable fatty infiltration of liver as above with focal sparing
adjacent to gallbladder fossa.

No new upper abdominal sonographic abnormalities.

## 2021-07-31 ENCOUNTER — Encounter: Payer: Self-pay | Admitting: Internal Medicine

## 2021-07-31 ENCOUNTER — Ambulatory Visit: Payer: BC Managed Care – PPO | Admitting: Internal Medicine

## 2021-07-31 VITALS — BP 138/88 | HR 61 | Temp 97.7°F | Resp 16 | Ht 64.0 in | Wt 188.2 lb

## 2021-07-31 DIAGNOSIS — K824 Cholesterolosis of gallbladder: Secondary | ICD-10-CM

## 2021-07-31 DIAGNOSIS — R202 Paresthesia of skin: Secondary | ICD-10-CM | POA: Diagnosis not present

## 2021-07-31 DIAGNOSIS — I10 Essential (primary) hypertension: Secondary | ICD-10-CM

## 2021-07-31 DIAGNOSIS — Z7185 Encounter for immunization safety counseling: Secondary | ICD-10-CM

## 2021-07-31 NOTE — Progress Notes (Signed)
   Subjective:    Patient ID: Evan Freeman, male    DOB: 1970-12-18, 50 y.o.   MRN: [id number]  DOS:  07/31/2021 Type of visit - description: rov  Today with talk about his chronic medical problems. In general he feels well. Did report L arm numbness.  The numbness is mostly from the elbow down. It is more noticeable when he is sleep with his elbow flexed or during the day when he uses his hands a lot at work. Denies neck pain No upper extremity swelling or rash.  Review of Systems See above   Past Medical History:  Diagnosis Date   Abdominal pain, chronic, epigastric 06/14/2014   Elevated BP    Gallbladder polyp 08/05/2014   History of stomach ulcers    EGD 2010   Hyperlipidemia    Infectious colitis     Past Surgical History:  Procedure Laterality Date   NO PAST SURGERIES      Allergies as of 07/31/2021   No Known Allergies      Medication List        Accurate as of July 31, 2021 10:13 AM. If you have any questions, ask your nurse or doctor.          amLODipine 5 MG tablet Commonly known as: NORVASC Take 1 tablet (5 mg total) by mouth daily.           Objective:   Physical Exam BP 138/88 (BP Location: Left Arm, Patient Position: Sitting, Cuff Size: Small)   Pulse 61   Temp 97.7 F (36.5 C) (Oral)   Resp 16   Ht 5\' 4"  (1.626 m)   Wt 188 lb 4 oz (85.4 kg)   SpO2 96%   BMI 32.31 kg/m  General:   Well developed, NAD, BMI noted. HEENT:  Normocephalic . Face symmetric, atraumatic Neck: Normal range of motion Lungs:  CTA B Normal respiratory effort, no intercostal retractions, no accessory muscle use. Heart: RRR,  no murmur.  Lower extremities: no pretibial edema bilaterally  Skin: Not pale. Not jaundice Neurologic:  alert & oriented X3.  Speech normal, gait appropriate for age and unassisted. Motor and DTR's: Symmetric Psych--  Cognition and judgment appear intact.  Cooperative with normal attention span and concentration.   Behavior appropriate. No anxious or depressed appearing.      Assessment      Assessment  Dyslipidemia HTN (previously elevated BP, started amlodipine ~ 01/2019) Chronic epigastric pain --EGD C~2010 PUD per Pt --CT abd neg 2010 --EGD 08-2014 Dr Deatra Ina: gastric erosions (bx chronic gastritis HP neg) , Rx PPI x 3 months , then prn Gallbladder polyps per [place] 06-2014,saw GI, incidental finding >>>> [place] again 3-17, [place] 08/2017= stable , next 1 year   Fatty liver per [place] 06-2014, normal LFTs COVID-19, admitted to the hospital, ~ 01/2019   PLAN: HTN: BP satisfactory, continue amlodipine Dyslipidemia: Reviewed recent labs, diet controlled.  Doing okay. GB polyp: Last [place] 02/06/2021: Next 1 year Paresthesias, R upper extremity: Suspect  either carpal or cubital tunnel syndrome.  Recommend avoidance of prolonged elbow flexion and use of CTS splinter. Vaccinations: Reports he is intolerant to COVID vaccines, declined a flu shot. RTC 6 months CPX     This visit occurred during the SARS-CoV-2 public health emergency.  Safety protocols were in place, including screening questions prior to the visit, additional usage of staff PPE, and extensive cleaning of exam room while observing appropriate contact time as indicated for disinfecting solutions.

## 2021-07-31 NOTE — Assessment & Plan Note (Signed)
HTN: BP satisfactory, continue amlodipine Dyslipidemia: Reviewed recent labs, diet controlled.  Doing okay. GB polyp: Last [place] 02/06/2021: Next 1 year Paresthesias, R upper extremity: Suspect  either carpal or cubital tunnel syndrome.  Recommend avoidance of prolonged elbow flexion and use of CTS splinter. Vaccinations: Reports he is intolerant to COVID vaccines, declined a flu shot. RTC 6 months CPX

## 2021-07-31 NOTE — Patient Instructions (Signed)
   GO TO THE FRONT DESK, PLEASE SCHEDULE YOUR APPOINTMENTS Come back for  a physical in 6 months

## 2022-02-10 ENCOUNTER — Encounter: Payer: Self-pay | Admitting: Internal Medicine

## 2022-02-12 ENCOUNTER — Encounter: Payer: BC Managed Care – PPO | Admitting: Internal Medicine

## 2022-02-25 ENCOUNTER — Telehealth: Payer: Self-pay | Admitting: Internal Medicine

## 2022-02-25 DIAGNOSIS — K824 Cholesterolosis of gallbladder: Secondary | ICD-10-CM

## 2022-02-25 NOTE — Telephone Encounter (Signed)
-   Kaylyn: Enter order for a RUQ abdomen ultrasound, DX gallbladder polyp. Rod Holler, please tell the patient to expect a phone call regarding the ultrasound

## 2022-02-26 NOTE — Telephone Encounter (Signed)
Order placed. Evan Freeman- will you give him imaging's number for him to call and schedule please?

## 2022-02-26 NOTE — Telephone Encounter (Signed)
Patient called and advised of results and [place] needed. He was given the number to radiology to call for appointment. He verbalized understanding.   This conversation was in [place]

## 2022-03-01 ENCOUNTER — Telehealth (HOSPITAL_BASED_OUTPATIENT_CLINIC_OR_DEPARTMENT_OTHER): Payer: Self-pay

## 2022-03-15 ENCOUNTER — Telehealth (HOSPITAL_BASED_OUTPATIENT_CLINIC_OR_DEPARTMENT_OTHER): Payer: Self-pay

## 2022-03-20 ENCOUNTER — Ambulatory Visit (HOSPITAL_BASED_OUTPATIENT_CLINIC_OR_DEPARTMENT_OTHER)
Admission: RE | Admit: 2022-03-20 | Discharge: 2022-03-20 | Disposition: A | Discharge status: Home / Self Care | Payer: BC Managed Care – PPO | Source: Ambulatory Visit | Attending: Internal Medicine | Admitting: Internal Medicine

## 2022-03-20 DIAGNOSIS — K824 Cholesterolosis of gallbladder: Secondary | ICD-10-CM | POA: Insufficient documentation

## 2022-04-09 ENCOUNTER — Encounter: Payer: Self-pay | Admitting: Internal Medicine

## 2022-04-09 ENCOUNTER — Ambulatory Visit (INDEPENDENT_AMBULATORY_CARE_PROVIDER_SITE_OTHER): Payer: BC Managed Care – PPO | Admitting: Internal Medicine

## 2022-04-09 VITALS — BP 155/95 | HR 68 | Temp 97.8°F | Ht 64.0 in | Wt 196.8 lb

## 2022-04-09 DIAGNOSIS — Z0000 Encounter for general adult medical examination without abnormal findings: Secondary | ICD-10-CM

## 2022-04-09 DIAGNOSIS — R739 Hyperglycemia, unspecified: Secondary | ICD-10-CM | POA: Diagnosis not present

## 2022-04-09 DIAGNOSIS — I10 Essential (primary) hypertension: Secondary | ICD-10-CM | POA: Diagnosis not present

## 2022-04-09 DIAGNOSIS — Z125 Encounter for screening for malignant neoplasm of prostate: Secondary | ICD-10-CM

## 2022-04-09 DIAGNOSIS — Z1211 Encounter for screening for malignant neoplasm of colon: Secondary | ICD-10-CM

## 2022-04-09 DIAGNOSIS — Z23 Encounter for immunization: Secondary | ICD-10-CM

## 2022-04-09 DIAGNOSIS — E785 Hyperlipidemia, unspecified: Secondary | ICD-10-CM

## 2022-04-09 LAB — COMPREHENSIVE METABOLIC PANEL
ALT: 51 U/L (ref 0–53)
AST: 28 U/L (ref 0–37)
Albumin: 4.4 g/dL (ref 3.5–5.2)
Alkaline Phosphatase: 59 U/L (ref 39–117)
BUN: 15 mg/dL (ref 6–23)
CO2: 27 mEq/L (ref 19–32)
Calcium: 9.1 mg/dL (ref 8.4–10.5)
Chloride: 102 mEq/L (ref 96–112)
Creatinine, Ser: 0.95 mg/dL (ref 0.40–1.50)
GFR: 92.45 mL/min (ref >60.00)
Glucose, Bld: 103 mg/dL — ABNORMAL HIGH (ref 70–99)
Potassium: 3.9 mEq/L (ref 3.5–5.1)
Sodium: 139 mEq/L (ref 135–145)
Total Bilirubin: 0.7 mg/dL (ref 0.2–1.2)
Total Protein: 6.9 g/dL (ref 6.0–8.3)

## 2022-04-09 LAB — CBC WITH DIFFERENTIAL/PLATELET
Basophils Absolute: 0 10*3/uL (ref 0.0–0.1)
Basophils Relative: 0.3 % (ref 0.0–3.0)
Eosinophils Absolute: 0.2 10*3/uL (ref 0.0–0.7)
Eosinophils Relative: 2.2 % (ref 0.0–5.0)
HCT: 44.7 % (ref 39.0–52.0)
Hemoglobin: 14.7 g/dL (ref 13.0–17.0)
Lymphocytes Relative: 38 % (ref 12.0–46.0)
Lymphs Abs: 2.9 10*3/uL (ref 0.7–4.0)
MCHC: 32.9 g/dL (ref 30.0–36.0)
MCV: 84 fl (ref 78.0–100.0)
Monocytes Absolute: 0.5 10*3/uL (ref 0.1–1.0)
Monocytes Relative: 6.2 % (ref 3.0–12.0)
Neutro Abs: 4 10*3/uL (ref 1.4–7.7)
Neutrophils Relative %: 53.3 % (ref 43.0–77.0)
Platelets: 183 10*3/uL (ref 150.0–400.0)
RBC: 5.33 Mil/uL (ref 4.22–5.81)
RDW: 14.9 % (ref 11.5–15.5)
WBC: 7.5 10*3/uL (ref 4.0–10.5)

## 2022-04-09 LAB — LIPID PANEL
Cholesterol: 236 mg/dL — ABNORMAL HIGH (ref 0–200)
HDL: 39 mg/dL — ABNORMAL LOW (ref >39.00)
LDL Cholesterol: 158 mg/dL — ABNORMAL HIGH (ref 0–99)
NonHDL: 197.11
Total CHOL/HDL Ratio: 6
Triglycerides: 196 mg/dL — ABNORMAL HIGH (ref 0.0–149.0)
VLDL: 39.2 mg/dL (ref 0.0–40.0)

## 2022-04-09 LAB — PSA: PSA: 0.42 ng/mL (ref 0.10–4.00)

## 2022-04-09 LAB — HEMOGLOBIN A1C: Hgb A1c MFr Bld: 6.2 % (ref 4.6–6.5)

## 2022-04-09 NOTE — Assessment & Plan Note (Signed)
Here for CPX Hyperglycemia: Recheck A1c.  Diet and exercise discussed Dyslipidemia: Diet controlled, checking labs HTN: On amlodipine 5 mg, BP upon arrival 172 / 102, recheck 155/95.  Reports ambulatory BPs are satisfactory. Plan: Monitor BPs, office visit 3 months. GB polyp: Next [place] 02-2023 All instructions written in English and discussed in [place].  Verbalized understanding. RTC 3 months

## 2022-04-09 NOTE — Assessment & Plan Note (Signed)
-  Td 2013.  Booster today - Shingrix: Discussed - COVID VAX: Booster benefits discussed. - Flu shot q. fall recommended -Colon cancer screening: No FH, I fob - 12/2019.  Failed  to return sample 2022.  3 options discussed, elected GI referral -Prostate cancer screening: no FH, DRE normal last year, no symptoms, checking a PSA -Lifestyle: Counseled. -Labs: CMP, FLP, CBC, A1c, PSA

## 2022-04-09 NOTE — Progress Notes (Signed)
Subjective:    Patient ID: Evan Freeman, male    DOB: March 02, 1971, 51 y.o.   MRN: [id number]  DOS:  04/09/2022 Type of visit - description: CPX  Since the last office visit is doing well. BP noted to be elevated, he checks his BPs typically once a week, usually in the 130s. Denies any stomach problems ordered on the occasional heartburn that responds well with OTCs as needed.   Review of Systems  Other than above, a 14 point review of systems is negative     Past Medical History:  Diagnosis Date   Abdominal pain, chronic, epigastric 06/14/2014   Gallbladder polyp 08/05/2014   History of stomach ulcers    EGD 2010   Hyperlipidemia    Infectious colitis     Past Surgical History:  Procedure Laterality Date   NO PAST SURGERIES     Social History   Socioeconomic History   Marital status: Married    Spouse name: Not on file   Number of children: 2   Years of education: Not on file   Highest education level: Not on file  Occupational History   Occupation: Insurance underwriter, Agricultural consultant: OTHER  Tobacco Use   Smoking status: Never   Smokeless tobacco: Never  Substance and Sexual Activity   Alcohol use: Yes    Alcohol/week: 0.0 standard drinks of alcohol    Comment: rare   Drug use: No   Sexual activity: Not on file  Other Topics Concern   Not on file  Social History Narrative   Original from [place]   7 grade education   Household-- pt, wife, and daughter   daughter 70; son 39, both live w/ him   Social Determinants of Radio broadcast assistant Strain: Not on file  Food Insecurity: Not on file  Transportation Needs: Not on file  Physical Activity: Not on file  Stress: Not on file  Social Connections: Not on file  Intimate Partner Violence: Not on file     Current Outpatient Medications  Medication Instructions   amLODipine (NORVASC) 5 mg, Oral, Daily       Objective:   Physical Exam BP (!) 155/95 (BP Location: Left Arm, Patient  Position: Sitting, Cuff Size: Normal)   Pulse 68   Temp 97.8 F (36.6 C) (Temporal)   Ht '5\' 4"'$  (1.626 m)   Wt 196 lb 12.8 oz (89.3 kg)   SpO2 98%   BMI 33.78 kg/m  General: Well developed, NAD, BMI noted Neck: No  thyromegaly  HEENT:  Normocephalic . Face symmetric, atraumatic Lungs:  CTA B Normal respiratory effort, no intercostal retractions, no accessory muscle use. Heart: RRR,  no murmur.  Abdomen:  Not distended, soft, non-tender. No rebound or rigidity.   Lower extremities: no pretibial edema bilaterally  Skin: Exposed areas without rash. Not pale. Not jaundice Neurologic:  alert & oriented X3.  Speech normal, gait appropriate for age and unassisted Strength symmetric and appropriate for age.  Psych: Cognition and judgment appear intact.  Cooperative with normal attention span and concentration.  Behavior appropriate. No anxious or depressed appearing.     Assessment    Assessment  Hyperglycemia Dyslipidemia HTN (previously elevated BP, started amlodipine ~ 01/2019) Chronic epigastric pain --EGD C~2010 PUD per Pt --CT abd neg 2010 --EGD 08-2014 Dr Deatra Ina: gastric erosions (bx chronic gastritis HP neg) , Rx PPI x 3 months , then prn Gallbladder polyps per [place] 06-2014,multiple f/u US, next 02-2023 Fatty liver per [place]  06-2014, normal LFTs COVID-19, admitted to the hospital, ~ 01/2019   PLAN: Here for CPX Hyperglycemia: Recheck A1c.  Diet and exercise discussed Dyslipidemia: Diet controlled, checking labs HTN: On amlodipine 5 mg, BP upon arrival 172 / 102, recheck 155/95.  Reports ambulatory BPs are satisfactory. Plan: Monitor BPs, office visit 3 months. GB polyp: Next [place] 02-2023 All instructions written in English and discussed in [place].  Verbalized understanding. RTC 3 months

## 2022-04-09 NOTE — Patient Instructions (Addendum)
Vaccines I recommend: COVID booster Flu shot every fall Shingrix  We are referring you to gastroenterology to get a colonoscopy. Please reach out to them at: [phone]   Check the  blood pressure regularly BP GOAL is between 110/65 and  135/85. If it is consistently higher or lower, let me know     GO TO THE LAB : Get the blood work     [place], [place] back for   a checkup in 3 months

## 2022-04-10 ENCOUNTER — Other Ambulatory Visit: Payer: Self-pay | Admitting: Internal Medicine

## 2022-04-10 MED ORDER — ATORVASTATIN CALCIUM 20 MG PO TABS: 20.0000 mg | ORAL_TABLET | Freq: Every day | ORAL | 4 refills | Status: DC

## 2022-04-14 IMAGING — US US ABDOMEN LIMITED
1 series · 14 of 25 positions shown · non-contrast
Comparison: None.

CLINICAL DATA: Follow-up gallbladder polyp.

EXAM:
ULTRASOUND ABDOMEN LIMITED RIGHT UPPER QUADRANT

[Series 1: us abdomen limited · 14 of 40 slices shown]
[im 1/40]
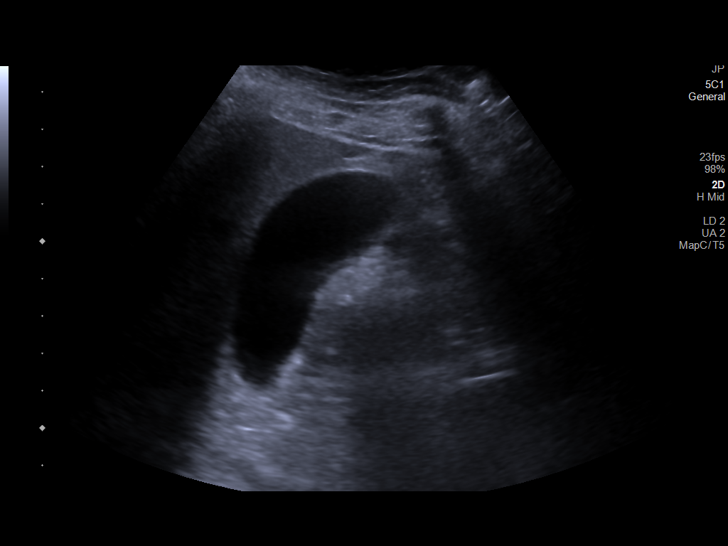
[im 4/40]
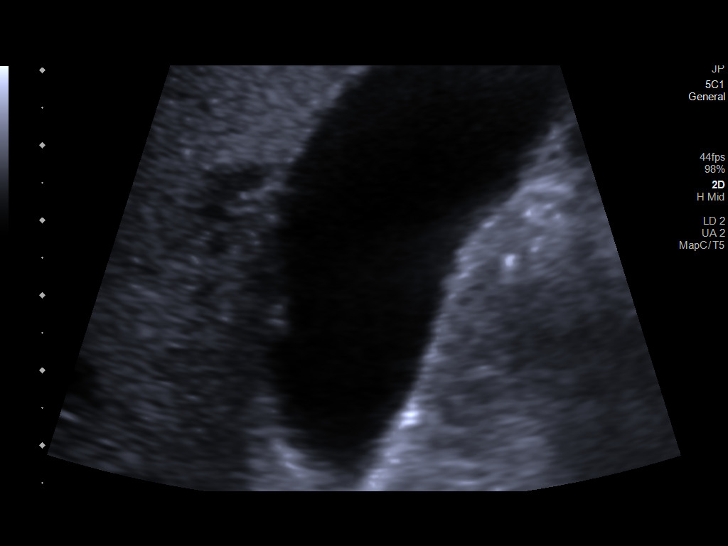
[im 7/40]
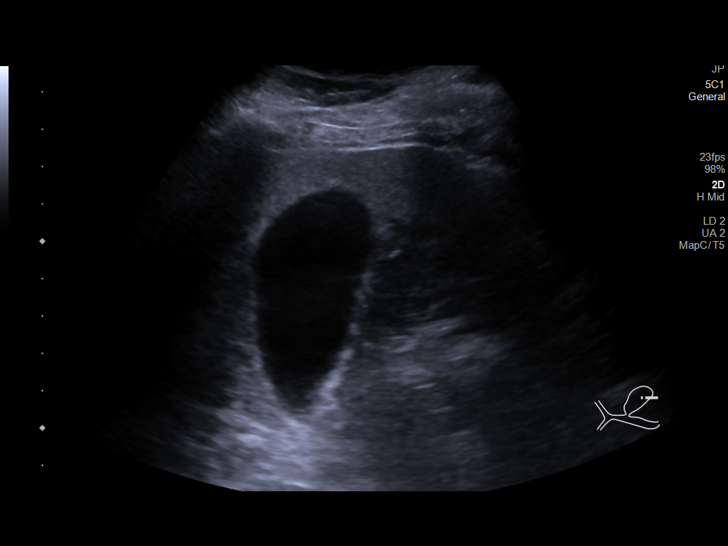
[im 10/40]
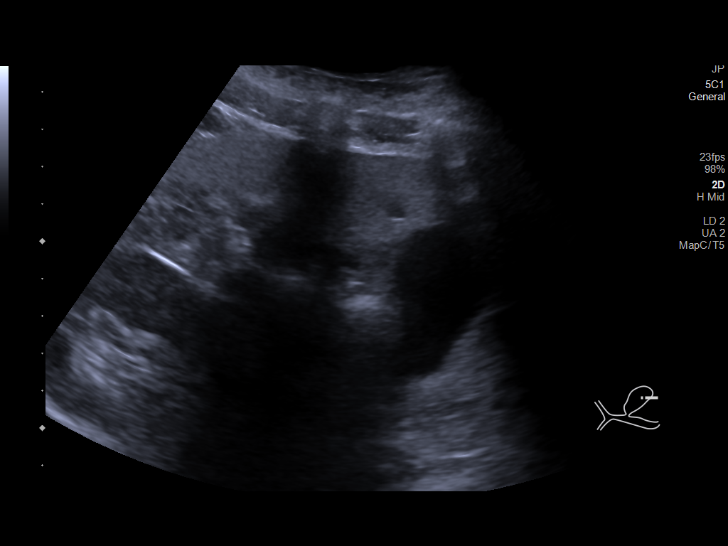
[im 14/40]
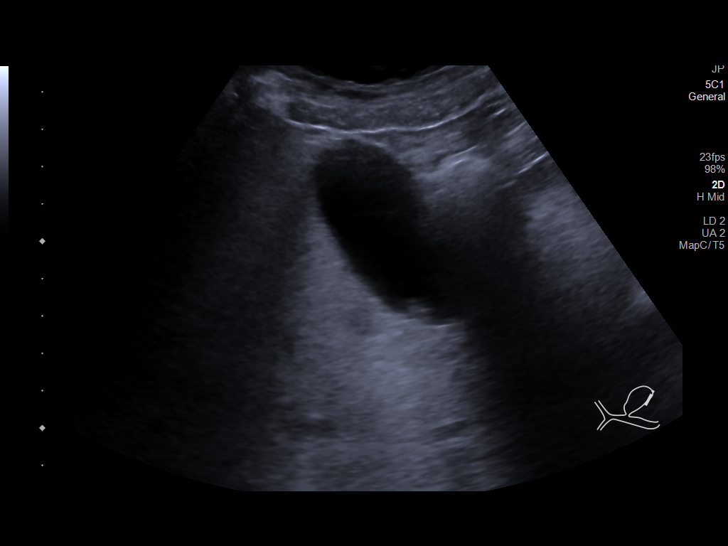
[im 15/40]
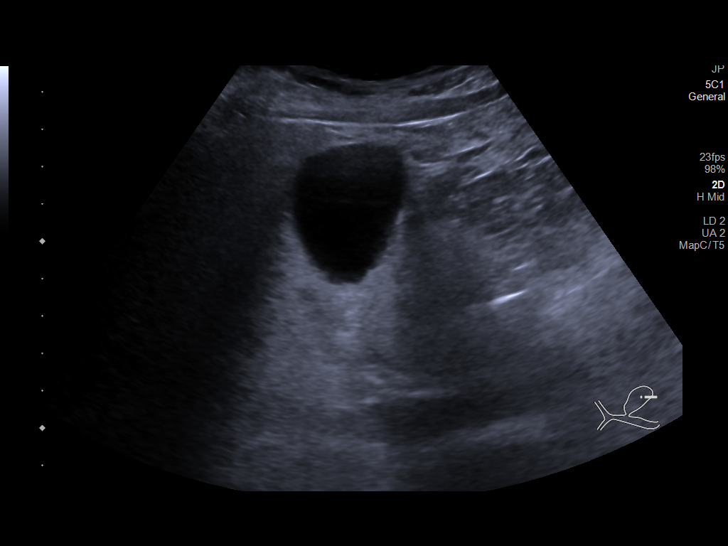
[im 18/40]
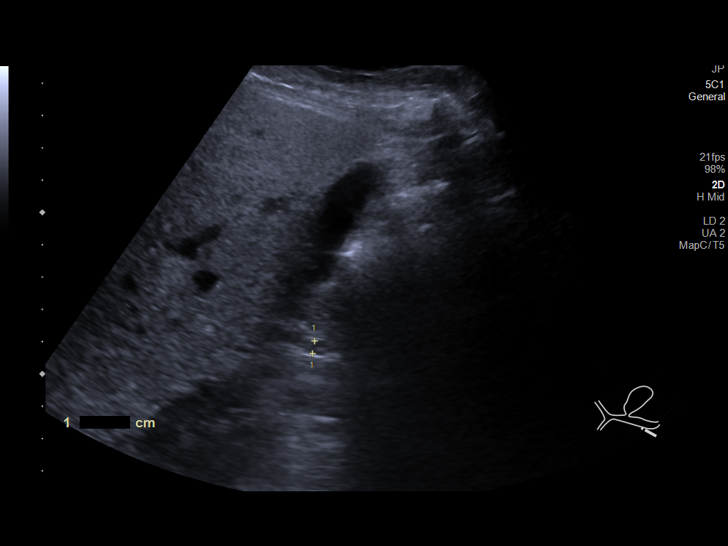
[im 22/40]
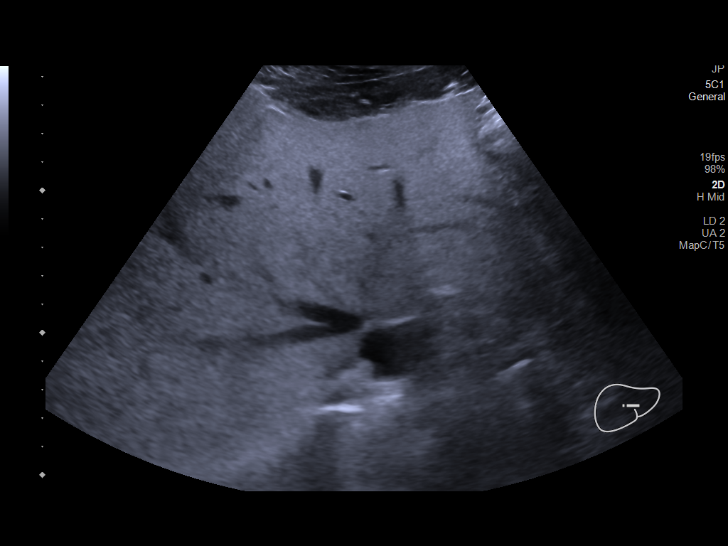
[im 25/40]
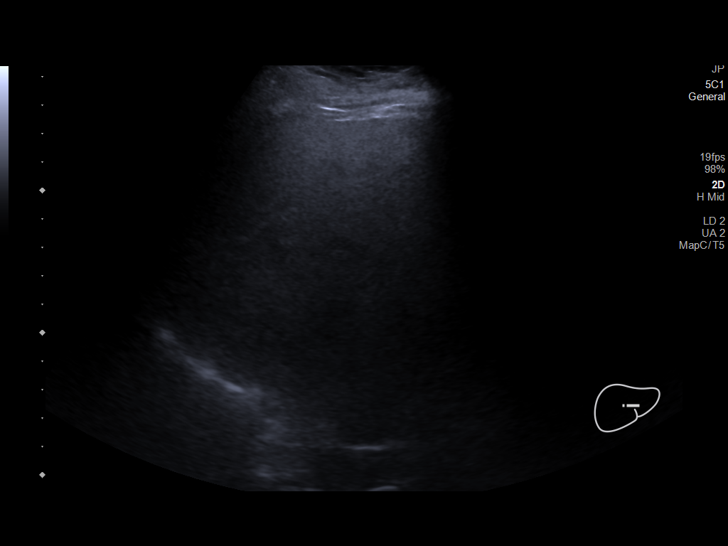
[im 27/40]
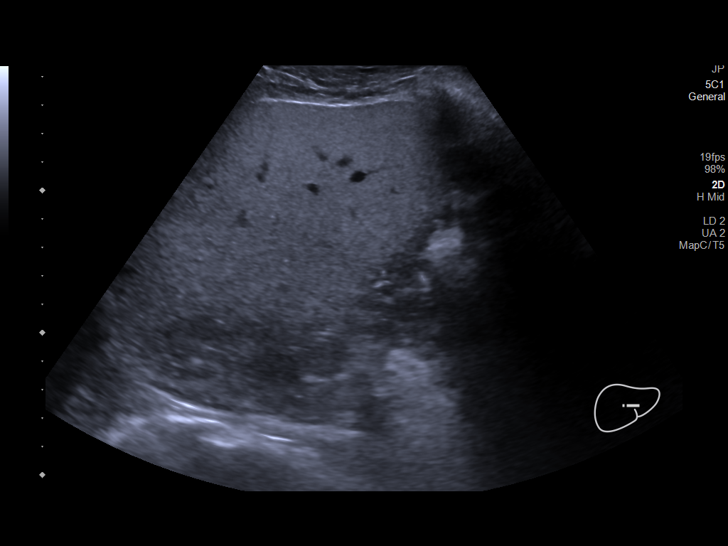
[im 30/40]
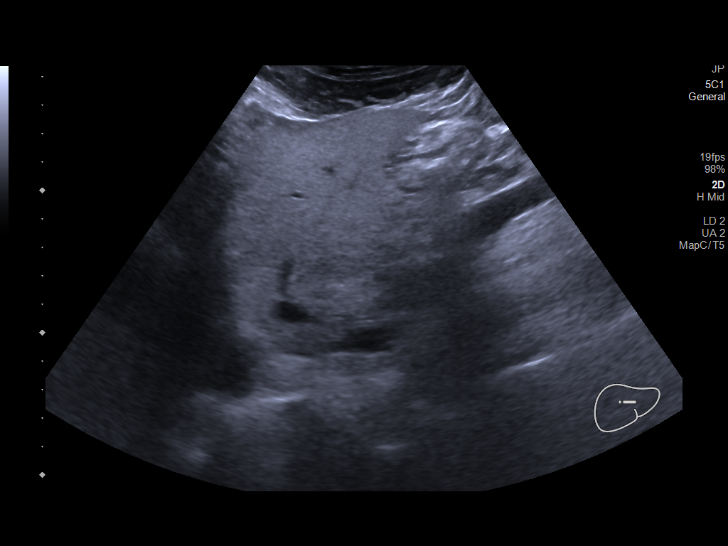
[im 33/40]
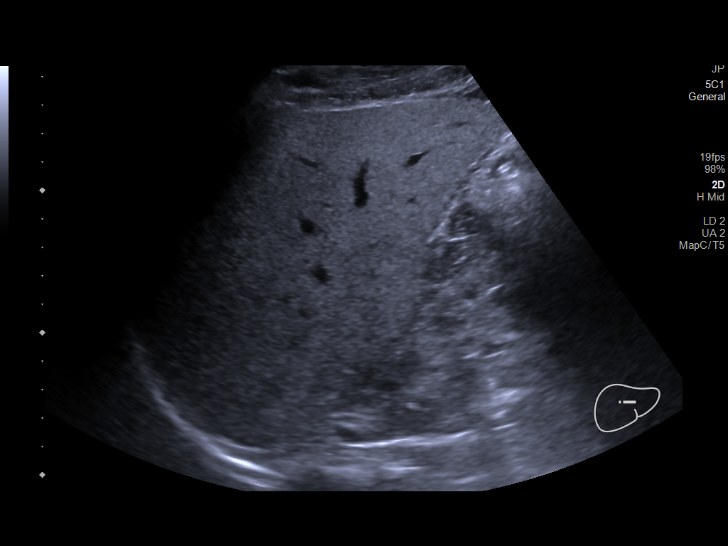
[im 36/40]
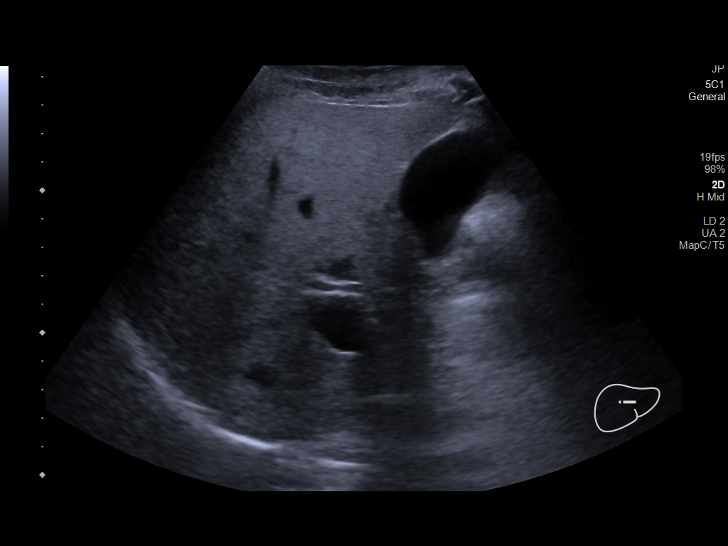
[im 40/40]
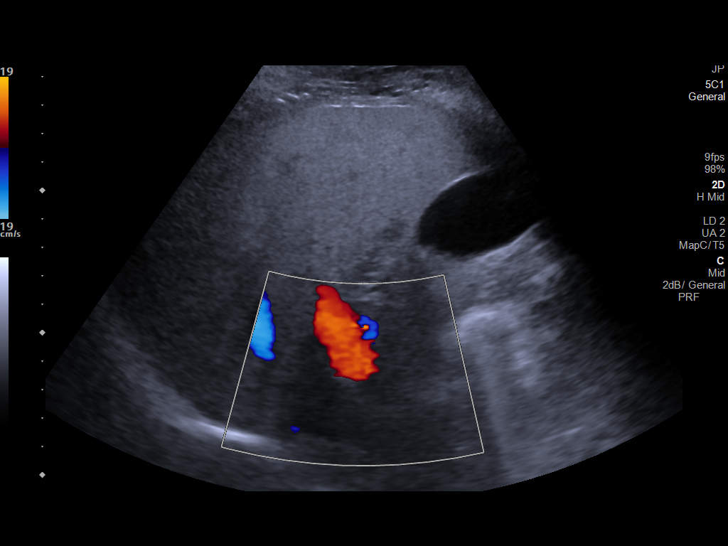

[14 of 25 positions shown; findings below may reference images not displayed]

FINDINGS: Gallbladder:

The previously identified polyp measures 5 mm today versus up to 9
mm previously. The gallbladder is otherwise normal in appearance.

Common bile duct:

Diameter: 4 mm

Liver:

Increased echogenicity with no focal mass. Portal vein is patent on
color Doppler imaging with normal direction of blood flow towards
the liver.

Other: None.
IMPRESSION: 1. The gallbladder polyp now measures 5 mm. This is smaller in the
interval. It is possible there was sludge adhered to the polyp on
the previous study. Regardless, recommend a 1 year follow-up to
ensure further stability.
2. Probable hepatic steatosis.

## 2022-05-14 ENCOUNTER — Encounter: Payer: Self-pay | Admitting: Gastroenterology

## 2022-05-25 ENCOUNTER — Ambulatory Visit (AMBULATORY_SURGERY_CENTER): Payer: Self-pay

## 2022-05-25 VITALS — Ht 64.0 in | Wt 197.0 lb

## 2022-05-25 DIAGNOSIS — Z1211 Encounter for screening for malignant neoplasm of colon: Secondary | ICD-10-CM

## 2022-05-25 MED ORDER — NA SULFATE-K SULFATE-MG SULF 17.5-3.13-1.6 GM/177ML PO SOLN: 1.0000 | ORAL | 0 refills | Status: DC

## 2022-05-25 NOTE — Progress Notes (Signed)
No egg or soy allergy known to patient  No issues known to pt with past sedation with any surgeries or procedures Patient denies ever being told they had issues or difficulty with intubation  No FH of Malignant Hyperthermia Pt is not on diet pills Pt is not on  home 02  Pt is not on blood thinners  Pt denies issues with constipation  No A fib or A flutter Have any cardiac testing pending--denied Pt instructed to use Single[employer]com or GoodRx for a price reduction on prep   PV conducted in person with online Spanish interpreter. Suprep instructions printed in [place] and English for patient/patient family. Instructions reviewed with interpreter and patient. Patient assured interpreter he could read printed instructions, but did not follow through the handout as encouraged. Questions were answered to his satisfaction. He was encouraged to call back with questions.

## 2022-06-10 ENCOUNTER — Encounter: Payer: Self-pay | Admitting: Gastroenterology

## 2022-06-18 ENCOUNTER — Ambulatory Visit (AMBULATORY_SURGERY_CENTER): Payer: BC Managed Care – PPO | Admitting: Gastroenterology

## 2022-06-18 ENCOUNTER — Encounter: Payer: Self-pay | Admitting: Gastroenterology

## 2022-06-18 VITALS — BP 120/60 | HR 75 | Temp 98.9°F | Resp 20 | Ht 64.0 in | Wt 197.0 lb

## 2022-06-18 DIAGNOSIS — K635 Polyp of colon: Secondary | ICD-10-CM | POA: Diagnosis not present

## 2022-06-18 DIAGNOSIS — D123 Benign neoplasm of transverse colon: Secondary | ICD-10-CM

## 2022-06-18 DIAGNOSIS — Z1211 Encounter for screening for malignant neoplasm of colon: Secondary | ICD-10-CM | POA: Diagnosis present

## 2022-06-18 MED ORDER — SODIUM CHLORIDE 0.9 % IV SOLN: 500.0000 mL | INTRAVENOUS | Status: DC

## 2022-06-18 NOTE — Progress Notes (Signed)
[facility] History and Physical   Primary Care Physician:  Colon Branch, MD   Reason for Procedure:   Colon cancer screening  Plan:    Screening colonoscopy     HPI: Evan Freeman is a 51 y.o. male undergoing initial average risk screening colonoscopy.  He has no family history of colon cancer (other than an uncle) and no chronic GI symptoms.    Past Medical History:  Diagnosis Date   Abdominal pain, chronic, epigastric 06/14/2014   Gallbladder polyp 08/05/2014   History of stomach ulcers    EGD 2010   Hyperlipidemia    Infectious colitis     Past Surgical History:  Procedure Laterality Date   NO PAST SURGERIES      Prior to Admission medications   Medication Sig Start Date End Date Taking? Authorizing Provider  amLODipine (NORVASC) 5 MG tablet Take 1 tablet (5 mg total) by mouth daily. 01/23/21  Yes Paz, Alda Berthold, MD  atorvastatin (LIPITOR) 20 MG tablet Take 1 tablet (20 mg total) by mouth daily. 04/10/22  Yes Colon Branch, MD    Current Outpatient Medications  Medication Sig Dispense Refill   amLODipine (NORVASC) 5 MG tablet Take 1 tablet (5 mg total) by mouth daily. 90 tablet 3   atorvastatin (LIPITOR) 20 MG tablet Take 1 tablet (20 mg total) by mouth daily. 30 tablet 4   Current Facility-Administered Medications  Medication Dose Route Frequency Provider Last Rate Last Admin   0.9 %  sodium chloride infusion  500 mL Intravenous Continuous Daryel November, MD        Allergies as of 06/18/2022   (No Known Allergies)    Family History  Problem Relation Age of Onset   CAD Mother 66   Hypertension Mother    Stomach cancer Father    Stomach cancer Brother    Gastric cancer Other        father, brother and cousin   Prostate cancer Neg Hx    Colon cancer Neg Hx    Diabetes Neg Hx    Esophageal cancer Neg Hx     Social History   Socioeconomic History   Marital status: Married    Spouse name: Not on file   Number of children: 2    Years of education: Not on file   Highest education level: Not on file  Occupational History   Occupation: Insurance underwriter, Agricultural consultant: OTHER  Tobacco Use   Smoking status: Never   Smokeless tobacco: Never  Substance and Sexual Activity   Alcohol use: Yes    Alcohol/week: 0.0 standard drinks of alcohol    Comment: rare   Drug use: No   Sexual activity: Not on file  Other Topics Concern   Not on file  Social History Narrative   Original from [place]   7 grade education   Household-- pt, wife, and daughter   daughter 76; son 49, both live w/ him   Social Determinants of Radio broadcast assistant Strain: Not on file  Food Insecurity: Not on file  Transportation Needs: Not on file  Physical Activity: Not on file  Stress: Not on file  Social Connections: Not on file  Intimate Partner Violence: Not on file    Review of Systems:  All other review of systems negative except as mentioned in the HPI.  Physical Exam: Vital signs BP 134/85   Pulse 68   Temp 98.9 F (37.2 C)   Ht '5\' 4"'$  (  1.626 m)   Wt 197 lb (89.4 kg)   SpO2 96%   BMI 33.81 kg/m   General:   Alert,  Well-developed, well-nourished, pleasant and cooperative in NAD Airway:  Mallampati 1 Lungs:  Clear throughout to auscultation.   Heart:  Regular rate and rhythm; no murmurs, clicks, rubs,  or gallops. Abdomen:  Soft, nontender and nondistended. Normal bowel sounds.   Neuro/Psych:  Normal mood and affect. A and O x 3   [name] E. Candis Schatz, MD [facility]

## 2022-06-18 NOTE — Op Note (Signed)
[place] Patient Name: Evan Freeman Procedure Date: 06/18/2022 1:32 PM MRN: [id number] Endoscopist: Nicki Reaper E. Candis Schatz , MD Age: 51 Referring MD:  Date of Birth: 09-13-1970 Gender: Male Account #: [id number] Procedure:                Colonoscopy Indications:              Screening for colorectal malignant neoplasm, This                            is the patient's first colonoscopy Medicines:                Monitored Anesthesia Care Procedure:                Pre-Anesthesia Assessment:                           - Prior to the procedure, a History and Physical                            was performed, and patient medications and                            allergies were reviewed. The patient's tolerance of                            previous anesthesia was also reviewed. The risks                            and benefits of the procedure and the sedation                            options and risks were discussed with the patient.                            All questions were answered, and informed consent                            was obtained. Prior Anticoagulants: The patient has                            taken no previous anticoagulant or antiplatelet                            agents. ASA Grade Assessment: II - A patient with                            mild systemic disease. After reviewing the risks                            and benefits, the patient was deemed in                            satisfactory condition to undergo the procedure.  After obtaining informed consent, the colonoscope                            was passed under direct vision. Throughout the                            procedure, the patient's blood pressure, pulse, and                            oxygen saturations were monitored continuously. The                            Colonoscope was introduced through the anus and                            advanced to the  the terminal ileum, with                            identification of the appendiceal orifice and IC                            valve. The colonoscopy was performed without                            difficulty. The patient tolerated the procedure                            well. The quality of the bowel preparation was                            good. The terminal ileum, ileocecal valve,                            appendiceal orifice, and rectum were photographed.                            The bowel preparation used was SUPREP via split                            dose instruction. Scope In: 1:37:43 PM Scope Out: 1:48:47 PM Scope Withdrawal Time: 0 hours 7 minutes 43 seconds  Total Procedure Duration: 0 hours 11 minutes 4 seconds  Findings:                 The perianal and digital rectal examinations were                            normal. Pertinent negatives include normal                            sphincter tone and no palpable rectal lesions.                           A 2 mm polyp was found in the transverse colon. The  polyp was sessile. The polyp was removed with a                            cold snare. Resection and retrieval were complete.                            Estimated blood loss was minimal.                           A few small-mouthed diverticula were found in the                            sigmoid colon.                           The exam was otherwise normal throughout the                            examined colon.                           The terminal ileum appeared normal.                           The retroflexed view of the distal rectum and anal                            verge was normal and showed no anal or rectal                            abnormalities. Complications:            No immediate complications. Estimated Blood Loss:     Estimated blood loss was minimal. Impression:               - One 2 mm polyp in the transverse  colon, removed                            with a cold snare. Resected and retrieved.                           - Diverticulosis in the sigmoid colon.                           - The examined portion of the ileum was normal.                           - The distal rectum and anal verge are normal on                            retroflexion view. Recommendation:           - Patient has a contact number available for                            emergencies. The signs and symptoms  of potential                            delayed complications were discussed with the                            patient. Return to normal activities tomorrow.                            Written discharge instructions were provided to the                            patient.                           - Resume previous diet.                           - Continue present medications.                           - Await pathology results.                           - Repeat colonoscopy (date not yet determined) for                            surveillance based on pathology results. Shyler Hamill E. Candis Schatz, MD 06/18/2022 1:55:49 PM This report has been signed electronically.

## 2022-06-18 NOTE — Progress Notes (Signed)
Pt's states no medical or surgical changes since previsit or office visit. 

## 2022-06-18 NOTE — Progress Notes (Signed)
Called to room to assist during endoscopic procedure.  Patient ID and intended procedure confirmed with present staff. Received instructions for my participation in the procedure from the performing physician.  

## 2022-06-18 NOTE — Patient Instructions (Signed)
USTED TUVO UN PROCEDIMIENTO ENDOSCPICO HOY EN EL [facility]:   Lea el informe del procedimiento que se le entreg para cualquier pregunta especfica sobre lo que se encontr durante su examen.  Si el informe del examen no responde a sus preguntas, por favor llame a su gastroenterlogo para aclararlo.  Si usted solicit que no se le den detalles de lo que se encontr en su procedimiento al acompaante que le va a cuidar, entonces el informe del procedimiento se ha incluido en un sobre sellado para que usted lo revise despus cuando le sea ms conveniente.   LO QUE PUEDE ESPERAR: Algunas sensaciones de hinchazn en el abdomen.  Puede tener ms gases de lo normal.  El caminar puede ayudarle a eliminar el aire que se le puso en el tracto gastrointestinal durante el procedimiento y reducir la hinchazn.  Si le hicieron una endoscopia inferior (como una colonoscopia o una sigmoidoscopia flexible), podra notar manchas de sangre en las heces fecales o en el papel higinico.  Si se someti a una preparacin intestinal para su procedimiento, es posible que no tenga una evacuacin intestinal normal durante algunos das.   Tenga en cuenta:  Es posible que note un poco de irritacin y congestin en la nariz o algn drenaje.  Esto es debido al oxgeno utilizado durante su procedimiento.  No hay que preocuparse y esto debe desaparecer ms o menos en un da.   SNTOMAS PARA REPORTAR INMEDIATAMENTE:  Despus de una endoscopia inferior (colonoscopia o sigmoidoscopia flexible):  Cantidades excesivas de sangre en las heces fecales  Sensibilidad significativa o empeoramiento de los dolores abdominales   Hinchazn aguda del abdomen que antes no tena   Fiebre de 100F o ms    Para asuntos urgentes o de emergencia, puede comunicarse con un gastroenterlogo a cualquier hora llamando al [phone].  DIETA:  Recomendamos una comida pequea al principio, pero luego puede continuar con su dieta normal.   Tome muchos lquidos, pero debe evitar las bebidas alcohlicas durante 24 horas.    ACTIVIDAD:  Debe planear tomarse las cosas con calma por el resto del da y no debe CONDUCIR ni usar maquinaria pesada hasta maana (debido a los medicamentos de sedacin utilizados durante el examen).     SEGUIMIENTO: Nuestro personal llamar al nmero que aparece en su historial al siguiente da hbil de su procedimiento para ver cmo se siente y para responder cualquier pregunta o inquietud que pueda tener con respecto a la informacin que se le dio despus del procedimiento. Si no podemos contactarle, le dejaremos un mensaje.  Sin embargo, si se siente bien y no tiene ningn problema, no es necesario que nos devuelva la llamada.  Asumiremos que ha regresado a sus actividades diarias normales sin incidentes. Si se le tomaron algunas biopsias, le contactaremos por telfono o por carta en las prximas 3 semanas.  Si no ha sabido nada sobre las biopsias en el transcurso de 3 semanas, por favor llmenos al [phone].   FIRMAS/CONFIDENCIALIDAD: Usted y/o el acompaante que le cuide han firmado documentos que se ingresarn en su historial mdico electrnico.  Estas firmas atestiguan el hecho de que la informacin anterior  

## 2022-06-18 NOTE — Progress Notes (Signed)
Interpreter used today at the [facility] for this pt.  Interpreter's name is-  Christia Reading

## 2022-06-18 NOTE — Progress Notes (Signed)
Sedate, gd SR, tolerated procedure well, VSS, report to RN 

## 2022-06-21 ENCOUNTER — Telehealth: Payer: Self-pay

## 2022-06-21 NOTE — Telephone Encounter (Signed)
No answer on follow up call. 

## 2022-06-27 ENCOUNTER — Encounter: Payer: Self-pay | Admitting: Gastroenterology

## 2022-07-30 ENCOUNTER — Ambulatory Visit: Payer: BC Managed Care – PPO | Admitting: Internal Medicine

## 2022-08-20 ENCOUNTER — Ambulatory Visit: Payer: BC Managed Care – PPO | Admitting: Internal Medicine

## 2022-08-27 ENCOUNTER — Encounter: Payer: Self-pay | Admitting: Internal Medicine

## 2022-08-27 ENCOUNTER — Ambulatory Visit: Payer: BC Managed Care – PPO | Admitting: Internal Medicine

## 2022-08-27 VITALS — BP 126/84 | HR 63 | Temp 98.3°F | Resp 16 | Ht 64.0 in | Wt 189.5 lb

## 2022-08-27 DIAGNOSIS — R1013 Epigastric pain: Secondary | ICD-10-CM | POA: Diagnosis not present

## 2022-08-27 DIAGNOSIS — I10 Essential (primary) hypertension: Secondary | ICD-10-CM

## 2022-08-27 DIAGNOSIS — E785 Hyperlipidemia, unspecified: Secondary | ICD-10-CM

## 2022-08-27 LAB — AST: AST: 22 U/L (ref 0–37)

## 2022-08-27 LAB — ALT: ALT: 32 U/L (ref 0–53)

## 2022-08-27 LAB — LIPID PANEL
Cholesterol: 220 mg/dL — ABNORMAL HIGH (ref 0–200)
HDL: 38.4 mg/dL — ABNORMAL LOW (ref >39.00)
LDL Cholesterol: 148 mg/dL — ABNORMAL HIGH (ref 0–99)
NonHDL: 181.91
Total CHOL/HDL Ratio: 6
Triglycerides: 168 mg/dL — ABNORMAL HIGH (ref 0.0–149.0)
VLDL: 33.6 mg/dL (ref 0.0–40.0)

## 2022-08-27 NOTE — Progress Notes (Signed)
   Subjective:    Patient ID: Evan Freeman, male    DOB: 07/04/1971, 51 y.o.   MRN: [id number]  DOS:  08/27/2022 Type of visit - description: f/u  Since the last visit he started atorvastatin, good compliance, no apparent side effects. Had a viral syndrome 2 weeks ago, much better except mild persistent cough. Check BPs regularly, mostly in the 140s. Had an episode of epigastric pain last month, lasted few days, now resolved.   Review of Systems See above   Past Medical History:  Diagnosis Date   Abdominal pain, chronic, epigastric 06/14/2014   Gallbladder polyp 08/05/2014   History of stomach ulcers    EGD 2010   Hyperlipidemia    Infectious colitis     Past Surgical History:  Procedure Laterality Date   NO PAST SURGERIES      Current Outpatient Medications  Medication Instructions   amLODipine (NORVASC) 5 mg, Oral, Daily   atorvastatin (LIPITOR) 20 mg, Oral, Daily       Objective:   Physical Exam BP 126/84   Pulse 63   Temp 98.3 F (36.8 C) (Oral)   Resp 16   Ht '5\' 4"'$  (1.626 m)   Wt 189 lb 8 oz (86 kg)   SpO2 98%   BMI 32.53 kg/m  General:   Well developed, NAD, BMI noted. HEENT:  Normocephalic . Face symmetric, atraumatic Lungs:  CTA B Normal respiratory effort, no intercostal retractions, no accessory muscle use. Heart: RRR,  no murmur.  Lower extremities: no pretibial edema bilaterally  Skin: Not pale. Not jaundice Neurologic:  alert & oriented X3.  Speech normal, gait appropriate for age and unassisted Psych--  Cognition and judgment appear intact.  Cooperative with normal attention span and concentration.  Behavior appropriate. No anxious or depressed appearing.      Assessment     Assessment  Hyperglycemia Dyslipidemia HTN ( started amlodipine ~ 01/2019) Chronic epigastric pain --EGD C~2010 PUD per Pt --CT abd neg 2010 --EGD 08-2014 Dr Deatra Ina: gastric erosions (bx chronic gastritis HP neg) , Rx PPI  --Gallbladder polyps per  [place] 06-2014,multiple f/u US, next 02-2023 --Fatty liver per [place] 06-2014, normal LFTs  PLAN: HTN: On amlodipine, ambulatory BPs mostly in the 140s, BP today is normal.  Recommend to continue amlodipine, watch BPs closely, tips on how to check BP correctly provided, low salt diet discussed. High cholesterol: Started atorvastatin 03-2022: Good compliance, no side effects, checking labs Cough: Had a viral syndrome a couple of weeks ago, has a mild persistent cough, lung exam normal, recommend OTCs. Chronic epigastric pain: Had an episode last month, now resolved.  Recommend to come back to the office if symptoms resurface or severe. Vaccine advice: Declined vaccines All of the above discussed in Spanish, patient verbalized understanding I accepted his wife as a new patient RTC CPX [phone]

## 2022-08-27 NOTE — Patient Instructions (Addendum)
Check the  blood pressure regularly BP GOAL is between 110/65 and  135/85. If it is consistently higher or lower, let me know  Advise the front desk that I am accepting your wife as a new patient   GO TO THE LAB : Get the blood work     [place], [place] back for a physical exam by 03-2023

## 2022-08-28 NOTE — Assessment & Plan Note (Signed)
HTN: On amlodipine, ambulatory BPs mostly in the 140s, BP today is normal.  Recommend to continue amlodipine, watch BPs closely, tips on how to check BP correctly provided, low salt diet discussed. High cholesterol: Started atorvastatin 03-2022: Good compliance, no side effects, checking labs Cough: Had a viral syndrome a couple of weeks ago, has a mild persistent cough, lung exam normal, recommend OTCs. Chronic epigastric pain: Had an episode last month, now resolved.  Recommend to come back to the office if symptoms resurface or severe. Vaccine advice: Declined vaccines All of the above discussed in Spanish, patient verbalized understanding I accepted his wife as a new patient RTC CPX [phone]

## 2022-08-31 MED ORDER — ATORVASTATIN CALCIUM 40 MG PO TABS: 40.0000 mg | ORAL_TABLET | Freq: Every day | ORAL | 1 refills | Status: DC

## 2022-08-31 NOTE — Addendum Note (Signed)
Addended byDamita Dunnings D on: 08/31/2022 01:36 PM   Modules accepted: Orders

## 2023-01-18 ENCOUNTER — Other Ambulatory Visit: Payer: Self-pay

## 2023-01-18 DIAGNOSIS — I10 Essential (primary) hypertension: Secondary | ICD-10-CM

## 2023-01-18 MED ORDER — AMLODIPINE BESYLATE 5 MG PO TABS: 5.0000 mg | ORAL_TABLET | Freq: Every day | ORAL | 1 refills | Status: DC

## 2023-03-24 ENCOUNTER — Telehealth: Payer: Self-pay | Admitting: Internal Medicine

## 2023-03-24 DIAGNOSIS — K824 Cholesterolosis of gallbladder: Secondary | ICD-10-CM

## 2023-03-24 NOTE — Telephone Encounter (Signed)
I have placed the order, but he requires a spanish speaker Evan Freeman is on bereavement the rest of the week and he doesn't have mychart set up). Would you be able to reach out to him please? To schedule he can call [phone].

## 2023-03-24 NOTE — Telephone Encounter (Signed)
Due for a RUQ abdominal ultrasound  DX: follow-up gallbladder polyp. Please arrange and let the patient know.

## 2023-03-25 NOTE — Telephone Encounter (Signed)
Letter mailed

## 2023-03-25 NOTE — Telephone Encounter (Signed)
Called, no answer.  Send the following letter.  [place],  Es tiempo de hacerle una sonografia de la vesicula, un estudio de rutina porque tiene polipos ahi Por favor llame a este telefono: [phone].  Ellos le pueden hacer la cita  Me llama si tiene preguntas  Willow Ora MD

## 2023-04-08 ENCOUNTER — Telehealth (HOSPITAL_BASED_OUTPATIENT_CLINIC_OR_DEPARTMENT_OTHER): Payer: Self-pay

## 2023-04-29 ENCOUNTER — Encounter: Payer: Self-pay | Admitting: Internal Medicine

## 2023-04-29 ENCOUNTER — Ambulatory Visit (INDEPENDENT_AMBULATORY_CARE_PROVIDER_SITE_OTHER): Payer: Managed Care, Other (non HMO) | Admitting: Internal Medicine

## 2023-04-29 VITALS — BP 140/100 | HR 63 | Temp 98.0°F | Resp 16 | Ht 64.0 in | Wt 198.5 lb

## 2023-04-29 DIAGNOSIS — K824 Cholesterolosis of gallbladder: Secondary | ICD-10-CM

## 2023-04-29 DIAGNOSIS — I10 Essential (primary) hypertension: Secondary | ICD-10-CM | POA: Diagnosis not present

## 2023-04-29 DIAGNOSIS — Z0000 Encounter for general adult medical examination without abnormal findings: Secondary | ICD-10-CM | POA: Diagnosis not present

## 2023-04-29 DIAGNOSIS — R202 Paresthesia of skin: Secondary | ICD-10-CM | POA: Diagnosis not present

## 2023-04-29 DIAGNOSIS — E785 Hyperlipidemia, unspecified: Secondary | ICD-10-CM

## 2023-04-29 DIAGNOSIS — R739 Hyperglycemia, unspecified: Secondary | ICD-10-CM

## 2023-04-29 DIAGNOSIS — Z0001 Encounter for general adult medical examination with abnormal findings: Secondary | ICD-10-CM

## 2023-04-29 LAB — BASIC METABOLIC PANEL
BUN: 12 mg/dL (ref 6–23)
CO2: 27 mEq/L (ref 19–32)
Calcium: 9 mg/dL (ref 8.4–10.5)
Chloride: 103 mEq/L (ref 96–112)
Creatinine, Ser: 0.83 mg/dL (ref 0.40–1.50)
GFR: 100.35 mL/min (ref >60.00)
Glucose, Bld: 98 mg/dL (ref 70–99)
Potassium: 4.1 mEq/L (ref 3.5–5.1)
Sodium: 138 mEq/L (ref 135–145)

## 2023-04-29 LAB — LIPID PANEL
Cholesterol: 154 mg/dL (ref 0–200)
HDL: 32.9 mg/dL — ABNORMAL LOW (ref >39.00)
LDL Cholesterol: 87 mg/dL (ref 0–99)
NonHDL: 120.82
Total CHOL/HDL Ratio: 5
Triglycerides: 169 mg/dL — ABNORMAL HIGH (ref 0.0–149.0)
VLDL: 33.8 mg/dL (ref 0.0–40.0)

## 2023-04-29 LAB — CBC WITH DIFFERENTIAL/PLATELET
Basophils Absolute: 0 10*3/uL (ref 0.0–0.1)
Basophils Relative: 0.5 % (ref 0.0–3.0)
Eosinophils Absolute: 0.3 10*3/uL (ref 0.0–0.7)
Eosinophils Relative: 3.4 % (ref 0.0–5.0)
HCT: 44.6 % (ref 39.0–52.0)
Hemoglobin: 14.4 g/dL (ref 13.0–17.0)
Lymphocytes Relative: 35.8 % (ref 12.0–46.0)
Lymphs Abs: 2.8 10*3/uL (ref 0.7–4.0)
MCHC: 32.3 g/dL (ref 30.0–36.0)
MCV: 84.2 fl (ref 78.0–100.0)
Monocytes Absolute: 0.6 10*3/uL (ref 0.1–1.0)
Monocytes Relative: 7.9 % (ref 3.0–12.0)
Neutro Abs: 4.1 10*3/uL (ref 1.4–7.7)
Neutrophils Relative %: 52.4 % (ref 43.0–77.0)
Platelets: 190 10*3/uL (ref 150.0–400.0)
RBC: 5.3 Mil/uL (ref 4.22–5.81)
RDW: 14.7 % (ref 11.5–15.5)
WBC: 7.8 10*3/uL (ref 4.0–10.5)

## 2023-04-29 LAB — PSA: PSA: 0.53 ng/mL (ref 0.10–4.00)

## 2023-04-29 LAB — ALT: ALT: 42 U/L (ref 0–53)

## 2023-04-29 LAB — AST: AST: 26 U/L (ref 0–37)

## 2023-04-29 LAB — HEMOGLOBIN A1C: Hgb A1c MFr Bld: 6.2 % (ref 4.6–6.5)

## 2023-04-29 MED ORDER — AMLODIPINE BESYLATE 10 MG PO TABS: 10.0000 mg | ORAL_TABLET | Freq: Every day | ORAL | 0 refills | Status: DC

## 2023-04-29 NOTE — Progress Notes (Unsigned)
Subjective:    Patient ID: Evan Freeman, male    DOB: [birth year], 52 y.o.   MRN: [id number]  DOS:  04/29/2023 Type of visit - description: CPX  Here for CPX Chronic medical problems were addressed. BP noted to be elevated, no ambulatory BPs Oxygen saturation was slightly low when he arrived, 92%.  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 Pt, also concerned about right hand numbness, in both sides of the hand,  night > day , no neck pain. Generalized cramps sometimes after he works.  He works outdoors exposed to the elements, heavy physical work.  Review of Systems  Other than above, a 14 point review of systems is negative     Past Medical History:  Diagnosis Date   Abdominal pain, chronic, epigastric 06/14/2014   Gallbladder polyp 08/05/2014   History of stomach ulcers    EGD 2010   Hyperlipidemia    Infectious colitis     Past Surgical History:  Procedure Laterality Date   NO PAST SURGERIES     Social History   Socioeconomic History   Marital status: Married    Spouse name: Not on file   Number of children: 2   Years of education: Not on file   Highest education level: Not on file  Occupational History   Occupation: Financial controller, Therapist, sports: OTHER  Tobacco Use   Smoking status: Never   Smokeless tobacco: Never  Substance and Sexual Activity   Alcohol use: Yes    Alcohol/week: 0.0 standard drinks of alcohol    Comment: rare   Drug use: No   Sexual activity: Not on file  Other Topics Concern   Not on file  Social History Narrative   Original from [place]   7 grade education   Household-- pt, wife    daughter 110; son 18    Social Determinants of Corporate investment banker Strain: Not on file  Food Insecurity: Not on file  Transportation Needs: Not on file  Physical Activity: Not on file  Stress: Not on file  Social Connections: Unknown (01/12/2022)   Received  from [facility]   Social Network    Social Network: Not on file  Intimate Partner Violence: Unknown (12/03/2021)   Received from [facility]   HITS    Physically Hurt: Not on file    Insult or Talk Down To: Not on file    Threaten Physical Harm: Not on file    Scream or Curse: Not on file    Current Outpatient Medications  Medication Instructions   amLODipine (NORVASC) 10 mg, Oral, Daily   atorvastatin (LIPITOR) 40 mg, Oral, Daily at bedtime       Objective:   Physical Exam BP (!) 140/100   Pulse 63   Temp 98 F (36.7 C) (Oral)   Resp 16   Ht 5\' 4"  (1.626 m)   Wt 198 lb 8 oz (90 kg)   SpO2 97%   BMI 34.07 kg/m  General: Well developed, NAD, BMI noted Neck: No  thyromegaly  HEENT:  Normocephalic . Face symmetric, atraumatic Lungs:  CTA B Normal respiratory effort, no intercostal retractions, no accessory muscle use. Heart: RRR,  no murmur.  Abdomen:  Not distended, soft, non-tender. No rebound or rigidity.   Lower extremities: no pretibial edema bilaterally  Skin: Exposed areas without rash. Not pale. Not jaundice Neurologic:  alert & oriented X3.  Speech normal, gait  appropriate for age and unassisted Strength symmetric and appropriate for age.  Psych: Cognition and judgment appear intact.  Cooperative with normal attention span and concentration.  Behavior appropriate. No anxious or depressed appearing.     Assessment    Assessment  Hyperglycemia Dyslipidemia HTN ( started amlodipine ~ 01/2019) Chronic epigastric pain --EGD C~2010 PUD per Pt --CT abd neg 2010 --EGD 08-2014 Dr Arlyce Dice: gastric erosions (bx chronic gastritis HP neg) , Rx PPI  --Gallbladder polyps per [place] 06-2014,multiple f/u US, next 02-2023 --Fatty liver per [place] 06-2014, normal LFTs  PLAN: Here for CPX -Td 2023 -Vaccines are recommended: Shingrix, flu shot every fall, new COVID-vaccine when available. -Colon cancer screening: No FH, colonoscopy 05-2022, next per GI. -Prostate  cancer screening: no FH,  checking a PSA -Lifestyle: Encourage healthy diet.  He is very active. -Labs:  BMP AST ALT FLP CBC A1c PSA Hyperglycemia: Last A1c 6.2, diet controlled, recheck. HTN: Initial BP 156/100, recheck 140/100.  Increase amlodipine to 10 mg daily.  Recommend ambulatory BPs.  Reassess in 3 months. Hyperlipidemia: Based on FLP from January 2024, I recommended increase atorvastatin to 40 mg daily, check labs  Hypoxia: O2 sat upon arrival 92%, subsequently 97%, no symptoms despite heavy work, recheck on RTC. Gallbladder polyp: Next ultrasound overdue.  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  Offered orthopedic referral but at this point we agreed to observation. Cramps, generalized, after he works on a very hot environment.  Recommend good hydration and if possible liquids with electrolytes such as Gatorade. All instructions discussed in Spanish RTC 3 months

## 2023-04-29 NOTE — Patient Instructions (Addendum)
Vaccines I recommend: Covid booster- new this fall Shingrix (shingles) Flu shot every fall  Your blood pressure is elevated.  Increase amlodipine to 10 mg daily.  We are sending a prescription.  Check the  blood pressure regularly Blood pressure goal:  between 110/65 and  135/85. If it is consistently higher or lower, let me know  GO TO THE LAB : Get the blood work     GO TO THE FRONT DESK, PLEASE SCHEDULE YOUR APPOINTMENTS Come back for a checkup in 3 months.  STOP BY THE FIRST FLOOR: Arrange for a gallbladder ultrasound

## 2023-04-30 ENCOUNTER — Encounter: Payer: Self-pay | Admitting: Internal Medicine

## 2023-04-30 NOTE — Assessment & Plan Note (Signed)
Here for CPX Hyperglycemia: Last A1c 6.2, diet controlled, recheck. HTN: Initial BP 156/100, recheck 140/100.  Increase amlodipine to 10 mg daily.  Recommend ambulatory BPs.  Reassess in 3 months. Hyperlipidemia: Based on FLP from January 2024, I recommended increase atorvastatin to 40 mg daily, check labs  Hypoxia: O2 sat upon arrival 92%, subsequently 97%, no symptoms despite heavy work, recheck on RTC. Gallbladder polyp: Next ultrasound overdue.  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  Offered orthopedic referral but at this point we agreed to observation. Cramps, generalized, after he works on a very hot environment.  Recommend good hydration and if possible liquids with electrolytes such as Gatorade. All instructions discussed in Spanish RTC 3 months

## 2023-04-30 NOTE — Assessment & Plan Note (Signed)
Here for CPX -Td 2023 -Vaccines are recommended: Shingrix, flu shot every fall, new COVID-vaccine when available. -Colon cancer screening: No FH, colonoscopy 05-2022, next per GI. -Prostate cancer screening: no FH,  checking a PSA -Lifestyle: Encourage healthy diet.  He is very active. -Labs:  BMP AST ALT FLP CBC A1c PSA

## 2023-06-02 ENCOUNTER — Ambulatory Visit (HOSPITAL_BASED_OUTPATIENT_CLINIC_OR_DEPARTMENT_OTHER): Payer: Managed Care, Other (non HMO)

## 2023-06-02 ENCOUNTER — Telehealth (HOSPITAL_BASED_OUTPATIENT_CLINIC_OR_DEPARTMENT_OTHER): Payer: Self-pay

## 2023-06-05 ENCOUNTER — Ambulatory Visit (HOSPITAL_BASED_OUTPATIENT_CLINIC_OR_DEPARTMENT_OTHER): Admission: RE | Admit: 2023-06-05 | Payer: Managed Care, Other (non HMO) | Source: Ambulatory Visit

## 2023-07-25 ENCOUNTER — Encounter (HOSPITAL_COMMUNITY): Payer: Self-pay | Admitting: *Deleted

## 2023-07-25 ENCOUNTER — Ambulatory Visit (HOSPITAL_COMMUNITY)
Admission: EM | Admit: 2023-07-25 | Discharge: 2023-07-25 | Disposition: A | Discharge status: Home / Self Care | Payer: Managed Care, Other (non HMO) | Attending: Family Medicine | Admitting: Family Medicine

## 2023-07-25 ENCOUNTER — Other Ambulatory Visit: Payer: Self-pay

## 2023-07-25 ENCOUNTER — Ambulatory Visit (INDEPENDENT_AMBULATORY_CARE_PROVIDER_SITE_OTHER): Payer: Managed Care, Other (non HMO)

## 2023-07-25 DIAGNOSIS — M79605 Pain in left leg: Secondary | ICD-10-CM | POA: Diagnosis not present

## 2023-07-25 MED ORDER — TIZANIDINE HCL 4 MG PO TABS: 4.0000 mg | ORAL_TABLET | Freq: Three times a day (TID) | ORAL | 0 refills | Status: DC | PRN

## 2023-07-25 MED ORDER — KETOROLAC TROMETHAMINE 30 MG/ML IJ SOLN
30.0000 mg | Freq: Once | INTRAMUSCULAR | Status: AC
  Administered 2023-07-25: 30 mg via INTRAMUSCULAR

## 2023-07-25 MED ORDER — KETOROLAC TROMETHAMINE 10 MG PO TABS: 10.0000 mg | ORAL_TABLET | Freq: Four times a day (QID) | ORAL | 0 refills | Status: DC | PRN

## 2023-07-25 MED ORDER — KETOROLAC TROMETHAMINE 30 MG/ML IJ SOLN
INTRAMUSCULAR | Status: AC
  Filled 2023-07-25: qty 1

## 2023-07-25 NOTE — Discharge Instructions (Signed)
X-ray shows some possible arthritic changes in the knee joint, but I do not think that is causing her pain.  This seems to be muscular in origin  You have been given a shot of Toradol 30 mg today.  Ketorolac 10 mg tablets--take 1 tablet every 6 hours as needed for pain.  This is the same medicine that is in the shot we just gave you   Take tizanidine 4 mg--1 every 8 hours as needed for muscle spasms; this medication can cause dizziness and sleepiness

## 2023-07-25 NOTE — ED Provider Notes (Addendum)
[facility]    CSN: [id number] Arrival date & time: 07/25/23  1309      History   Chief Complaint Chief Complaint  Patient presents with   Leg Pain    HPI Evan Freeman is a 52 y.o. male.    Leg Pain Here for left leg pain.  It is mostly his posterior left leg from the mid posterior thigh down to the end of the left calf.  It is worst when he tries to walk.  It does also hurt a little bit anteriorly.  No trauma  Tylenol has not helped much  Past medical history includes hypertension and high cholesterol.  He has not taken his medication for his blood pressure yet today.  Past Medical History:  Diagnosis Date   Abdominal pain, chronic, epigastric 06/14/2014   Gallbladder polyp 08/05/2014   History of stomach ulcers    EGD 2010   Hyperlipidemia    Infectious colitis     Patient Active Problem List   Diagnosis Date Noted   Hyperglycemia 04/09/2022   Dyslipidemia 04/09/2022   Follow-up --------------PCP notes  05/13/2015   Annual physical exam 05/12/2015   Gallbladder polyp 08/05/2014   Hypertension    ABDOMINAL PAIN, EPIGASTRIC 04/30/2009    Past Surgical History:  Procedure Laterality Date   NO PAST SURGERIES         Home Medications    Prior to Admission medications   Medication Sig Start Date End Date Taking? Authorizing Provider  ketorolac (TORADOL) 10 MG tablet Take 1 tablet (10 mg total) by mouth every 6 (six) hours as needed (pain). 07/25/23  Yes Zenia Resides, MD  tiZANidine (ZANAFLEX) 4 MG tablet Take 1 tablet (4 mg total) by mouth every 8 (eight) hours as needed for muscle spasms. 07/25/23  Yes Zenia Resides, MD  amLODipine (NORVASC) 10 MG tablet Take 1 tablet (10 mg total) by mouth daily. 04/29/23   Wanda Plump, MD  atorvastatin (LIPITOR) 40 MG tablet Take 1 tablet (40 mg total) by mouth at bedtime. 08/31/22   Wanda Plump, MD    Family History Family History  Problem Relation Age of Onset   CAD Mother 50    Hypertension Mother    Stomach cancer Father    Stomach cancer Brother    Gastric cancer Other        father, brother and cousin   Prostate cancer Neg Hx    Colon cancer Neg Hx    Diabetes Neg Hx    Esophageal cancer Neg Hx     Social History Social History   Tobacco Use   Smoking status: Never   Smokeless tobacco: Never  Substance Use Topics   Alcohol use: Yes    Alcohol/week: 0.0 standard drinks of alcohol    Comment: rare   Drug use: No     Allergies   Patient has no known allergies.   Review of Systems Review of Systems   Physical Exam Triage Vital Signs ED Triage Vitals  Encounter Vitals Group     BP 07/25/23 1438 (!) 162/104     Systolic BP Percentile --      Diastolic BP Percentile --      Pulse Rate 07/25/23 1438 79     Resp 07/25/23 1438 18     Temp 07/25/23 1438 97.6 F (36.4 C)     Temp src --      SpO2 07/25/23 1438 96 %     Weight --  Height --      Head Circumference --      Peak Flow --      Pain Score 07/25/23 1434 10     Pain Loc --      Pain Education --      Exclude from Growth Chart --    No data found.  Updated Vital Signs BP (!) 162/104   Pulse 79   Temp 97.6 F (36.4 C)   Resp 18   SpO2 96%   Visual Acuity Right Eye Distance:   Left Eye Distance:   Bilateral Distance:    Right Eye Near:   Left Eye Near:    Bilateral Near:     Physical Exam Vitals reviewed.  Constitutional:      General: He is not in acute distress.    Appearance: He is not ill-appearing, toxic-appearing or diaphoretic.  Musculoskeletal:     Comments: He is tender over his left calf and posterior thigh.  There is no edema or erythema or ecchymosis.  No deformity.  Range of motion is normal about the knee.   Skin:    Coloration: Skin is not pale.  Neurological:     General: No focal deficit present.     Mental Status: He is alert and oriented to person, place, and time.  Psychiatric:        Behavior: Behavior normal.      UC  Treatments / Results  Labs (all labs ordered are listed, but only abnormal results are displayed) Labs Reviewed - No data to display  EKG   Radiology No results found.  Procedures Procedures (including critical care time)  Medications Ordered in UC Medications  ketorolac (TORADOL) 30 MG/ML injection 30 mg (has no administration in time range)    Initial Impression / Assessment and Plan / UC Course  I have reviewed the triage vital signs and the nursing notes.  Pertinent labs & imaging results that were available during my care of the patient were reviewed by me and considered in my medical decision making (see chart for details).     He requests x-rays despite our discussion that this is most likely muscular in nature.  Visit is conducted in Spanish  By my review, there are some degenerative changes of the left knee, particularly at the upper pole of the patella and on the posterior side of the tibia on the lateral view.  Do not see any acute bony changes.  There is a bone island in the medial aspect of the knee.   Toradol injection is given here and Toradol tablets are sent to the pharmacy, along with tizanidine muscle relaxer.  I have asked him follow-up with his primary care and he is given con information for orthopedics Final Clinical Impressions(s) / UC Diagnoses   Final diagnoses:  Left leg pain     Discharge Instructions      X-ray shows some possible arthritic changes in the knee joint, but I do not think that is causing her pain.  This seems to be muscular in origin  You have been given a shot of Toradol 30 mg today.  Ketorolac 10 mg tablets--take 1 tablet every 6 hours as needed for pain.  This is the same medicine that is in the shot we just gave you   Take tizanidine 4 mg--1 every 8 hours as needed for muscle spasms; this medication can cause dizziness and sleepiness       ED Prescriptions  Medication Sig Dispense Auth. Provider    ketorolac (TORADOL) 10 MG tablet Take 1 tablet (10 mg total) by mouth every 6 (six) hours as needed (pain). 20 tablet Jerzie Bieri, Janace Aris, MD   tiZANidine (ZANAFLEX) 4 MG tablet Take 1 tablet (4 mg total) by mouth every 8 (eight) hours as needed for muscle spasms. 15 tablet Demetri Kerman, Janace Aris, MD      PDMP not reviewed this encounter.   Zenia Resides, MD 07/25/23 1518    Zenia Resides, MD 07/25/23 [phone]

## 2023-07-25 NOTE — ED Triage Notes (Signed)
Pt reports pain in Lt leg fpr 2 weeks. Pt reports pain is down the back of his leg hip to ankle.

## 2023-08-12 ENCOUNTER — Ambulatory Visit: Payer: Managed Care, Other (non HMO) | Admitting: Internal Medicine

## 2023-08-12 ENCOUNTER — Encounter: Payer: Self-pay | Admitting: Internal Medicine

## 2023-08-12 VITALS — BP 146/84 | HR 87 | Temp 97.7°F | Resp 16 | Ht 64.0 in | Wt 196.1 lb

## 2023-08-12 DIAGNOSIS — E785 Hyperlipidemia, unspecified: Secondary | ICD-10-CM

## 2023-08-12 DIAGNOSIS — M79605 Pain in left leg: Secondary | ICD-10-CM

## 2023-08-12 DIAGNOSIS — I10 Essential (primary) hypertension: Secondary | ICD-10-CM | POA: Diagnosis not present

## 2023-08-12 MED ORDER — TIZANIDINE HCL 4 MG PO TABS: 4.0000 mg | ORAL_TABLET | Freq: Two times a day (BID) | ORAL | 0 refills | Status: DC | PRN

## 2023-08-12 MED ORDER — LOSARTAN POTASSIUM 50 MG PO TABS: 50.0000 mg | ORAL_TABLET | Freq: Every day | ORAL | 1 refills | Status: DC

## 2023-08-12 MED ORDER — PREDNISONE 10 MG PO TABS: ORAL_TABLET | ORAL | 0 refills | Status: DC

## 2023-08-12 NOTE — Patient Instructions (Addendum)
SU PRESION ESTA ALTA  CONTINUE AMLODIPINE 10 MG UNA AL DIA  EMPIEZE LOSARTAN 50 MG UNA LA DIA  REGRESE EN 2 SEMANAS PARA UN NURSE VISIT: HAY QUE TOMARLE LA PRESION Y HACERLE UNA PRUEBA DE  SANGRE   REGRESE EN 3 MESES PARA UNA VISITA CONMIGO   PARA EL DOLOR:  LO REFERIMOS  A SPORTS MEDICINE  TOME PREDNISONE POR UNOS DIAS  LE MANDE UN REFILL DE TIZANIDINE , UN RELAJANTE MUSCULAR   TOME TYLENOL 500 MG: 1-2 TABLETAS CADA 8 HORAS

## 2023-08-12 NOTE — Progress Notes (Unsigned)
   Subjective:    Patient ID: Evan Freeman, male    DOB: [birth year], 52 y.o.   MRN: [id number]  DOS:  08/12/2023 Type of visit - description: f/u  Follow-up from previous visit. Chronic medical problems addressed. Also, about 4 weeks ago developed pain that runs from the left buttock to the left calf. Triggered by walking, no paresthesias, no motor deficits, no edema, no injury. His job sent him to urgent care. Pain is still there, not severe.  Review of Systems See above   Past Medical History:  Diagnosis Date   Abdominal pain, chronic, epigastric 06/14/2014   Gallbladder polyp 08/05/2014   History of stomach ulcers    EGD 2010   Hyperlipidemia    Infectious colitis     Past Surgical History:  Procedure Laterality Date   NO PAST SURGERIES      Current Outpatient Medications  Medication Instructions   amLODipine (NORVASC) 10 mg, Oral, Daily   atorvastatin (LIPITOR) 40 mg, Oral, Daily at bedtime   losartan (COZAAR) 50 mg, Oral, Daily   predniSONE (DELTASONE) 10 MG tablet 3 tabs x 3 days, 2 tabs x 3 days, 1 tab x 3 days   tiZANidine (ZANAFLEX) 4 mg, Oral, 2 times daily PRN       Objective:   Physical Exam BP (!) 146/84   Pulse 87   Temp 97.7 F (36.5 C) (Oral)   Resp 16   Ht 5\' 4"  (1.626 m)   Wt 196 lb 2 oz (89 kg)   SpO2 99%   BMI 33.66 kg/m  General:   Well developed, NAD, BMI noted. HEENT:  Normocephalic . Face symmetric, atraumatic MSK: No TTP at the lumbar spine Lower extremities: no pretibial edema bilaterally  Skin: Not pale. Not jaundice Neurologic:  alert & oriented X3.  Speech normal, gait appropriate for age and unassisted Motor and DTR symmetric.  Straight leg test negative. Psych--  Cognition and judgment appear intact.  Cooperative with normal attention span and concentration.  Behavior appropriate. No anxious or depressed appearing.      Assessment   Assessment  Hyperglycemia Dyslipidemia HTN ( started amlodipine ~  01/2019) Chronic epigastric pain --EGD C~2010 PUD per Pt --CT abd neg 2010 --EGD 08-2014 Dr Arlyce Dice: gastric erosions (bx chronic gastritis HP neg) , Rx PPI  --Gallbladder polyps per [place] 06-2014,multiple f/u US, next 02-2023 --Fatty liver per [place] 06-2014, normal LFTs  PLAN: Hyperglycemia: Last A1c 6.2.  Stable. High cholesterol: On atorvastatin, great response, LDL decreased to 87. HTN: Controlled? No ambulatory BPs.  BP at the urgent care 07/25/2023: 162/104. Today 146/92, 146/84. Plan: Continue amlodipine 10 mg, losartan 50 mg 1 tablet daily, nurse visit in 2 weeks for BP check and BMP.   L leg pain: Radiculopathy?  Round of prednisone, RF tizanidine, Tylenol, sports medicine referral. The following instructions were discussed and printed for the patient in Spanish: For pain: Take prednisone for few days Tylenol  500 mg OTC 2 tabs a day every 8 hours as needed for pain Tizanidine, a muscle relaxant twice daily as needed.  Watch for drowsiness We are referring you to a sports medicine doctor For high blood pressure: Continue amlodipine Start losartan 50 mg 1 tablet every day Come back in 2 weeks for a nurse visit blood work RTC 3 months

## 2023-08-13 NOTE — Assessment & Plan Note (Signed)
Hyperglycemia: Last A1c 6.2.  Stable. High cholesterol: On atorvastatin, great response, LDL decreased to 87. HTN: Controlled? No ambulatory BPs.  BP at the urgent care 07/25/2023: 162/104. Today 146/92, 146/84. Plan: Continue amlodipine 10 mg, add losartan 50 mg 1 tablet daily, nurse visit in 2 weeks for BP check and BMP.   L leg pain: Radiculopathy?  Rx  prednisone, RF tizanidine, Tylenol, sports medicine referral. The following instructions were discussed and printed for the patient in Spanish: For pain: Take prednisone for few days Tylenol  500 mg OTC 2 tabs a day every 8 hours as needed for pain Tizanidine, a muscle relaxant twice daily as needed.  Watch for drowsiness We are referring you to a sports medicine doctor For high blood pressure: Continue amlodipine Start losartan 50 mg 1 tablet every day Come back in 2 weeks for a nurse visit blood work RTC 3 months

## 2023-08-25 NOTE — Progress Notes (Signed)
Pt here for Blood pressure check per Dr Drue Novel on 08/12/23:   "CONTINUE AMLODIPINE 10 MG UNA AL DIA  EMPIEZE LOSARTAN 50 MG UNA LA DIA REGRESE EN 2 SEMANAS PARA UN NURSE VISIT: HAY QUE TOMARLE LA PRESION Y HACERLE UNA PRUEBA DE  SANGRE"  Pt currently takes: Amlodopine 10 mg Losartan 50 mg daily (added at last OV) Pt reports compliance with medication. BP Readings from Last 3 Encounters:  08/12/23 (!) 146/84  07/25/23 (!) 162/104  04/29/23 (!) 140/100    Patient reports he was supposed to replace Amlodipine with Losartan instead of taking them together. Her has been taking Losartan 50 mg only for the past 2 weeks. BP today @ =152/ 92  HR =72  Pt advised per Dr Drue Novel: patient was advised to continue Losartan 50 mg and add the Amlodipine 10 mg. Follow up in one month for NV BP check. Appointment scheduled for 09/23/23.  Lab appt scheduled for today as well. 3 month follow up on 11/11/23

## 2023-08-26 ENCOUNTER — Ambulatory Visit: Payer: Managed Care, Other (non HMO)

## 2023-08-26 ENCOUNTER — Other Ambulatory Visit (INDEPENDENT_AMBULATORY_CARE_PROVIDER_SITE_OTHER): Payer: Managed Care, Other (non HMO)

## 2023-08-26 VITALS — BP 152/92

## 2023-08-26 DIAGNOSIS — I10 Essential (primary) hypertension: Secondary | ICD-10-CM

## 2023-08-26 LAB — BASIC METABOLIC PANEL
BUN: 14 mg/dL (ref 6–23)
CO2: 29 meq/L (ref 19–32)
Calcium: 8.7 mg/dL (ref 8.4–10.5)
Chloride: 103 meq/L (ref 96–112)
Creatinine, Ser: 0.93 mg/dL (ref 0.40–1.50)
GFR: 93.93 mL/min (ref >60.00)
Glucose, Bld: 103 mg/dL — ABNORMAL HIGH (ref 70–99)
Potassium: 4 meq/L (ref 3.5–5.1)
Sodium: 139 meq/L (ref 135–145)

## 2023-08-26 NOTE — Patient Instructions (Signed)
Su proxima cita sera el dia 09/23/23 a las 9 am.

## 2023-09-08 NOTE — Progress Notes (Signed)
 LILLETTE Ileana Collet, PhD, LAT, ATC acting as a scribe for Artist Lloyd, MD.  Domnick Chervenak is a 53 y.o. male who presents to [facility] at [facility] today for L leg pain ongoing since mid-Nov. He was seen at [facility] on 11/25 for this issue. Pt locates pain to L buttock, w/ radiating pain along the posterior aspect of L leg to calf. Pain also located across the L knee. Pt works in holiday representative  Low back pain: no Radiating pain: yes LE numbness/tingling: no LE weakness: yes Aggravates: increased activity, worse at the end of the work Treatments tried: prednisone , tizanidine , Tylenol   Dx imaging: 07/25/23 L knee XR  Pertinent review of systems: No fevers or chills  Relevant historical information: Hypertension   Exam:  BP (!) 152/92   Pulse 78   Ht 5' 4 (1.626 m)   Wt 194 lb (88 kg)   SpO2 98%   BMI 33.30 kg/m  General: Well Developed, well nourished, and in no acute distress.   MSK: L-spine nontender palpation Normal lumbar motion. Lower extremity strength is intact. Mildly positive left-sided straight leg raise test.  Left posterior thigh and calf mildly tender to palpation.  Some pain with resisted knee flexion and resisted ankle plantarflexion.  Left knee normal-appearing normal motion with crepitation.  Tender palpation anterior knee. Stable ligamentous exam.    Lab and Radiology Results  Procedure: Real-time Ultrasound Guided Injection of left knee joint superior lateral patella space Device: Philips Affiniti 50G/GE Logiq Images permanently stored and available for review in PACS Verbal informed consent obtained.  Discussed risks and benefits of procedure. Warned about infection, bleeding, hyperglycemia damage to structures among others. Patient expresses understanding and agreement Time-out conducted.   Noted no overlying erythema, induration, or other signs of local infection.   Skin prepped in a sterile fashion.   Local anesthesia:  Topical Ethyl chloride.   With sterile technique and under real time ultrasound guidance: 40 mg of Kenalog and 2 mL of Marcaine  injected into knee joint. Fluid seen entering the joint capsule.   Completed without difficulty   Pain immediately resolved suggesting accurate placement of the medication.   Advised to call if fevers/chills, erythema, induration, drainage, or persistent bleeding.   Images permanently stored and available for review in the ultrasound unit.  Impression: Technically successful ultrasound guided injection.   DG Lumbar Spine 2-3 Views Result Date: 09/09/2023 CLINICAL DATA:  Lumbar radiculopathy with left leg pain. EXAM: LUMBAR SPINE - 2-3 VIEW COMPARISON:  None Available. FINDINGS: There is no evidence of lumbar spine fracture. 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 Minimal anterior spurring noted in the lumbar spine. IMPRESSION: Grade 1 anterolisthesis of L5 on S1 with bilateral pars defect of L5. Electronically Signed   By: Craig Farr M.D.   On: 09/09/2023 09:09   I, Artist Lloyd, personally (independently) visualized and performed the interpretation of the images attached in this note.    EXAM: LEFT KNEE - 1-2 VIEW   COMPARISON:  None Available.   FINDINGS: No evidence of fracture, dislocation, or joint effusion. No evidence of arthropathy or other focal bone abnormality. Soft tissues are unremarkable.   IMPRESSION: Negative.     Electronically Signed   By: Lynwood Landy Raddle M.D.   On: 07/25/2023 15:45  Assessment and Plan: 53 y.o. male with multifactorial left leg pain.  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  Plan for intra-articular steroid injection and physical therapy.  Left posterior leg pain.  Differential includes lumbosacral radiculopathy most consistent with left S1.  Additionally he may have pain originating from the hamstring and calf musculature due to strain.  Both of these issues could be treated  with physical therapy.  We found the physical therapy location that has hours that would be amenable.  Plan for trial of physical therapy and a course of prednisone  and gabapentin  for his left posterior leg pain.  Recheck in 8 weeks.  X-ray today does show anterior listhesis with pars defects L5-S1 that is contribution Tory to his pain.  PT should be very helpful for this.  Visit today conducted using a Spanish interpreter.   PDMP not reviewed this encounter. Orders Placed This Encounter  Procedures   DG Lumbar Spine 2-3 Views    Standing Status:   Future    Expiration Date:   10/10/2023    Reason for Exam (SYMPTOM  OR DIAGNOSIS REQUIRED):   lumbar radiculpathy    Preferred imaging location?:   [place] [place]   US  LIMITED JOINT SPACE STRUCTURES LOW LEFT(NO LINKED CHARGES)    Reason for Exam (SYMPTOM  OR DIAGNOSIS REQUIRED):   left knee pain    Preferred imaging location?:   [facility] referral to Physical Therapy    Referral Priority:   Routine    Referral Type:   Physical Medicine    Referral Reason:   Specialty Services Required    Requested Specialty:   Physical Therapy    Number of Visits Requested:   1   Meds ordered this encounter  Medications   gabapentin  (NEURONTIN ) 300 MG capsule    Sig: Take 1 capsule (300 mg total) by mouth 3 (three) times daily as needed.    Dispense:  90 capsule    Refill:  1   predniSONE  (DELTASONE ) 50 MG tablet    Sig: Take 1 pill daily for 5 days    Dispense:  5 tablet    Refill:  0     Discussed warning signs or symptoms. Please see discharge instructions. Patient expresses understanding.   The above documentation has been reviewed and is accurate and complete Artist Lloyd, M.D.

## 2023-09-09 ENCOUNTER — Other Ambulatory Visit: Payer: Self-pay

## 2023-09-09 ENCOUNTER — Ambulatory Visit: Payer: Managed Care, Other (non HMO) | Admitting: Family Medicine

## 2023-09-09 ENCOUNTER — Ambulatory Visit (INDEPENDENT_AMBULATORY_CARE_PROVIDER_SITE_OTHER): Payer: Managed Care, Other (non HMO)

## 2023-09-09 VITALS — BP 152/92 | HR 78 | Ht 64.0 in | Wt 194.0 lb

## 2023-09-09 DIAGNOSIS — M4306 Spondylolysis, lumbar region: Secondary | ICD-10-CM | POA: Diagnosis not present

## 2023-09-09 DIAGNOSIS — M79652 Pain in left thigh: Secondary | ICD-10-CM | POA: Diagnosis not present

## 2023-09-09 DIAGNOSIS — G8929 Other chronic pain: Secondary | ICD-10-CM | POA: Diagnosis not present

## 2023-09-09 DIAGNOSIS — M5416 Radiculopathy, lumbar region: Secondary | ICD-10-CM

## 2023-09-09 DIAGNOSIS — M25562 Pain in left knee: Secondary | ICD-10-CM | POA: Diagnosis not present

## 2023-09-09 MED ORDER — PREDNISONE 50 MG PO TABS: ORAL_TABLET | ORAL | 0 refills | Status: DC

## 2023-09-09 MED ORDER — GABAPENTIN 300 MG PO CAPS: 300.0000 mg | ORAL_CAPSULE | Freq: Three times a day (TID) | ORAL | 1 refills | Status: DC | PRN

## 2023-09-09 NOTE — Patient Instructions (Addendum)
 Thank you for coming in today.   You received an injection today. Seek immediate medical attention if the joint becomes red, extremely painful, or is oozing fluid.   I've referred you to Physical Therapy.  Let us  know if you don't hear from them in one week.   Please get an Xray today before you leave   Check back in 8 weeks

## 2023-09-12 NOTE — Progress Notes (Signed)
 Low back x-ray shows pars defects and anterolisthesis at L4-5.  This is where the back can shift.  Physical therapy is very helpful for this.  If we can get your muscles in your back stronger we can reduce how much the back shifts and will make that pain get a lot better.

## 2023-09-23 ENCOUNTER — Ambulatory Visit: Payer: Managed Care, Other (non HMO)

## 2023-10-04 ENCOUNTER — Other Ambulatory Visit: Payer: Self-pay | Admitting: Internal Medicine

## 2023-11-03 NOTE — Progress Notes (Signed)
 I, Stevenson Clinch, CMA acting as a Neurosurgeon for Clementeen Graham, MD.  Evan Freeman is a 53 y.o. male who presents to [facility] at [facility] today for f/u L knee and radicular pain into the L leg. Pt was last seen by Dr. Denyse Amass on 09/09/23 and was given a L knee steroid injection. He was also prescribed gabapentin, prednisone, and was referred to [facility] PT.   Today, pt reports worsening knee pain s/p injection. Sx became more constant and are now causing night disturbance. Some swelling. Has tried ice with no relief. Sx flare up with work, does Holiday representative. Takes tylenol for more severe pain with short-term relief. Also notes having pain at lateral hip, this started after the injection, feels like it could be related to abnormal gait. BP elevated today, has not taken BP meds this morning.   Dx imaging: 09/09/23 L-spine XR 07/25/23 L knee XR   Pertinent review of systems: No fevers or chills  Relevant historical information: Hypertension   Exam:  BP (!) 162/100   Pulse 77   Ht 5\' 4"  (1.626 m)   Wt 195 lb (88.5 kg)   SpO2 95%   BMI 33.47 kg/m  General: Well Developed, well nourished, and in no acute distress.   MSK: Left knee moderate effusion normal-appearing otherwise normal motion.  Positive McMurray's test.  Left hip normal-appearing normal motion. Tender palpation lateral hip overlying greater trochanter.  Hip abduction strength is weak 4/5 with pain.  External rotation strength is intact without pain.    Lab and Radiology Results  Diagnostic Limited MSK Ultrasound of: Left knee Moderate to large joint effusion is present.  Intact quad and patellar tendons. Medial joint line partially extruded medial meniscus. Impression: Joint effusion and medial meniscus degeneration with extrusion.   EXAM: LUMBAR SPINE - 2-3 VIEW   COMPARISON:  None Available.   FINDINGS: There is no evidence of lumbar spine fracture. Grade 1 anterolisthesis of L5 on S1  with bilateral pars defect of L5. Minimal anterior spurring noted in the lumbar spine.   IMPRESSION: Grade 1 anterolisthesis of L5 on S1 with bilateral pars defect of L5.     Electronically Signed   By: Sherian Rein M.D.   On: 09/09/2023 09:09  EXAM: LEFT KNEE - 1-2 VIEW   COMPARISON:  None Available.   FINDINGS: No evidence of fracture, dislocation, or joint effusion. No evidence of arthropathy or other focal bone abnormality. Soft tissues are unremarkable.   IMPRESSION: Negative.     Electronically Signed   By: Lupita Raider M.D.   On: 07/25/2023 15:45    Assessment and Plan: 53 y.o. male with chronic left knee pain and swelling.  Not improved with steroid injection in early January.  Additionally he is using compression sleeve without much help.  X-ray in November was unremarkable.  Plan for MRI of the knee to further evaluate source of pain and for further treatment planning.  I did prescribe meloxicam to use temporarily although this will increase his blood pressure further.  Recheck after MRI results come back.  He does have a way to contact me with his daughter who speaks English fMRI is not getting scheduled appropriately.  Additionally he has evidence of left trochanteric bursitis.  Home exercise program reviewed.  Consider physical therapy in the future if needed.  PT is going to be challenging to arrange due to his work schedule.  Ativan prescribed for MRI claustrophobia.  Pars defect seen on x-ray addressed.  He is not having much back pain or radiculopathy at this time.   PDMP reviewed during this encounter. Orders Placed This Encounter  Procedures   [place] LIMITED JOINT SPACE STRUCTURES LOW LEFT(NO LINKED CHARGES)    Reason for Exam (SYMPTOM  OR DIAGNOSIS REQUIRED):   L LE pain    Preferred imaging location?:   [facility]   MR KNEE LEFT WO CONTRAST    Standing Status:   Future    Expiration Date:   12/05/2023    What is the  patient's sedation requirement?:   No Sedation    Does the patient have a pacemaker or implanted devices?:   No    Preferred imaging location?:   [facility] (table limit-350lbs)   Meds ordered this encounter  Medications   meloxicam (MOBIC) 15 MG tablet    Sig: One tab PO qAM with breakfast for 2 weeks, then daily prn pain.    Dispense:  30 tablet    Refill:  1   LORazepam (ATIVAN) 0.5 MG tablet    Sig: 1-2 tabs 30 - 60 min prior to MRI. Do not drive with this medicine.    Dispense:  4 tablet    Refill:  0     Discussed warning signs or symptoms. Please see discharge instructions. Patient expresses understanding.   The above documentation has been reviewed and is accurate and complete Clementeen Graham, M.D.

## 2023-11-04 ENCOUNTER — Ambulatory Visit: Payer: Managed Care, Other (non HMO) | Admitting: Family Medicine

## 2023-11-04 ENCOUNTER — Encounter: Payer: Self-pay | Admitting: Family Medicine

## 2023-11-04 ENCOUNTER — Other Ambulatory Visit: Payer: Self-pay

## 2023-11-04 VITALS — BP 160/100 | HR 77 | Ht 64.0 in | Wt 195.0 lb

## 2023-11-04 DIAGNOSIS — M25562 Pain in left knee: Secondary | ICD-10-CM

## 2023-11-04 DIAGNOSIS — M7062 Trochanteric bursitis, left hip: Secondary | ICD-10-CM | POA: Diagnosis not present

## 2023-11-04 DIAGNOSIS — G8929 Other chronic pain: Secondary | ICD-10-CM | POA: Diagnosis not present

## 2023-11-04 MED ORDER — MELOXICAM 15 MG PO TABS: ORAL_TABLET | ORAL | 1 refills | Status: DC

## 2023-11-04 MED ORDER — LORAZEPAM 0.5 MG PO TABS: ORAL_TABLET | ORAL | 0 refills | Status: DC

## 2023-11-04 NOTE — Patient Instructions (Addendum)
 Thank you for coming in today.  MRI should call you soon.   Meloxicam for pain daily   Return after MRI results come back and we call you.    Rehabilitacin de la bursitis de la cadera Hip Bursitis Rehab Pregunte al mdico qu ejercicios son seguros para usted. Haga los ejercicios exactamente como se lo haya indicado el mdico y gradelos como se lo hayan indicado. Es normal sentir un leve estiramiento, tironeo, opresin o Dentist al [employer] ejercicios. Detngase de inmediato si siente un dolor repentino o Community education officer. No comience a hacer estos ejercicios hasta que se lo indique el mdico. Ejercicio de estiramiento Este ejercicio calienta los msculos y las articulaciones, y mejora la movilidad y la flexibilidad de la cadera. Adems, ayuda a Engineer, materials y la rigidez. Estiramiento de la zona ileotibial La zona ileotibial es una banda fuerte de tejido muscular que va desde la parte exterior de la cadera [employer] parte exterior del muslo y la rodilla. Recustese de costado con la pierna izquierda/derecha arriba. Flexione la rodilla izquierda/derecha y tmese del tobillo. Estire el brazo de abajo para Pharmacologist el equilibrio. Lleve lentamente la rodilla hacia atrs, de modo que el muslo quede ligeramente detrs del cuerpo. Baje suavemente la rodilla hacia el suelo hasta sentir un ligero estiramiento en la zona externa del muslo izquierdo/derecho. Si no siente un estiramiento y no puede bajar ms la rodilla hacia el suelo, coloque el taln del otro pie por encima de la rodilla y empuje la rodilla un poco ms hacia abajo con el pie. Mantenga esta posicin durante __________ segundos. Vuelva lentamente a la posicin inicial. Repita __________ veces. Realice este ejercicio __________ veces al da. Ejercicios de fortalecimiento Estos ejercicios fortalecen la cadera y la pelvis, y les otorgan resistencia. La resistencia es la capacidad de usar los msculos durante un tiempo prolongado,  incluso despus de que se cansen. Puente Este ejercicio fortalece los msculos que mueven el muslo hacia atrs (extensores de cadera). Recustese boca arriba sobre una superficie firme con las rodillas flexionadas y los pies completamente apoyados en el suelo. Tensione los [employer] de las nalgas y TEFL teacher que el tronco quede al nivel de los muslos. No arquee la espalda. Debe sentir el trabajo muscular en las nalgas y la parte de atrs de los muslos. Si no siente el esfuerzo de [employer], aleje los pies 1 a 2 pulgadas (2.5 a 5 cm) de las nalgas. Si este ejercicio le resulta muy fcil, intente realizarlo con los brazos cruzados [place]. Mantenga esta posicin durante __________ segundos. Baje lentamente la cadera a la posicin inicial. Deje que los msculos se relajen completamente despus de cada repeticin. Repita __________ veces. Realice este ejercicio __________ veces al da. Cuclillas Este ejercicio fortalece los msculos de la parte de adelante del muslo y la rodilla (cudriceps). Prese enfrente de [employer], con los pies y las rodillas apuntando hacia adelante. Puede colocar las [employer] la mesa para mantener el equilibrio, pero no para apoyarse. Flexione lentamente las rodillas y baje la cadera, como si fuera a sentarse en una silla. Mantenga el [employer] talones, no [employer] dedos. Mantenga la parte inferior de las piernas rectas, de modo que queden paralelas a las patas de la mesa. No deje que la cadera quede ms abajo que las rodillas. No flexione las rodillas ms de lo indicado por el mdico. Si el dolor de cadera [place], no las flexione [place]. Mantenga la posicin de cuclillas  durante __________ segundos. Haga fuerza con las piernas para pararse de [place]. No use las manos para pararse. Repita __________ veces. Realice este ejercicio __________ veces al da. Elevacin de cadera  Prese de costado en un primer escaln. Prese sobre la pierna  izquierda/derecha con el otro pie sin apoyo junto al escaln. Sostngase de la baranda o de la pared para Pharmacologist el equilibrio si es necesario. Mantenga las rodillas estiradas y el torso derecho. Luego eleve la cadera izquierda/derecha hacia el techo. Mantenga esta posicin durante __________ segundos. Lentamente baje la cadera izquierda/derecha en direccin al suelo, ms abajo de la posicin inicial. El pie debe acercarse ms al suelo. No incline ni flexione las rodillas. Repita __________ veces. Realice este ejercicio __________ veces al da. Pararse sobre una pierna Este ejercicio incrementa el equilibrio. Sin calzado, prese cerca de una baranda o [place]. Puede sostenerse de la baranda o del marco de la puerta, segn lo necesite para Radio producer equilibrio. Contraiga el msculo izquierdo/derecho de la nalga y luego levante el otro pie. No deje que la cadera izquierda/derecha se desplace hacia un lado. Es til pararse delante de un espejo para este ejercicio, a fin de verse la cadera. Mantenga esta posicin durante __________ segundos. Repita __________ veces. Realice este ejercicio __________ veces al da. Esta informacin no tiene Theme park manager el consejo del mdico. Asegrese de hacerle al mdico cualquier pregunta que tenga. Document Revised: 08/27/2021 Document Reviewed: 08/27/2021 Elsevier Patient Education  2024 [employer].

## 2023-11-11 ENCOUNTER — Encounter: Payer: Self-pay | Admitting: Internal Medicine

## 2023-11-11 ENCOUNTER — Ambulatory Visit: Payer: Managed Care, Other (non HMO) | Admitting: Internal Medicine

## 2023-11-11 VITALS — BP 156/100 | HR 87 | Temp 97.9°F | Resp 16 | Ht 64.0 in | Wt 193.2 lb

## 2023-11-11 DIAGNOSIS — I10 Essential (primary) hypertension: Secondary | ICD-10-CM | POA: Diagnosis not present

## 2023-11-11 MED ORDER — AMLODIPINE BESYLATE 10 MG PO TABS: 10.0000 mg | ORAL_TABLET | Freq: Every day | ORAL | 3 refills | Status: AC

## 2023-11-11 NOTE — Patient Instructions (Addendum)
  Take the medications as prescribed including amlodipine and losartan.  Make an appointment in 6 weeks for a nurse visit to check your blood pressure.  Make an appointment to see me in 3 months   TOME SUS MEDICINAS PARA LA PRESION (AMLODIPINE AND LOSARTAN)  HAGA UNA CITA PARA UN "NURSE VISIT"   HAGA UNA CITA PARA SU "FOLLOW UP" EN 3 MESES

## 2023-11-11 NOTE — Assessment & Plan Note (Signed)
 HTN: In December, losartan was added to amlodipine for better BP control, subsequent BMP okay.  He ran out of amlodipine a month ago, BP has been consistently elevated but asymptomatic. Plan: Advised patient not to run out of medicine, if needed call directly to this office. RF amlodipine, continue losartan, nurse visit in 6 weeks, OV with me 3 months. He is on NSAIDs, recommend to take it with food and good hydration. All discussed with the patient in Spanish and he verbalized understanding.

## 2023-11-11 NOTE — Progress Notes (Signed)
   Subjective:    Patient ID: Evan Freeman, male    DOB: 1971/07/08, 53 y.o.   MRN: [id number]  DOS:  11/11/2023 Type of visit - description: Follow-up  Here for hypertension follow-up. Taking losartan without apparent side effects. Unfortunately pharmacy told him there was no refills for amlodipine so he stopped taking it a month ago. BP is elevated, denies headache, nausea vomiting. Sees a sports medicine, on NSAIDs.  BP Readings from Last 3 Encounters:  11/11/23 (!) 146/100  11/04/23 (!) 160/100  09/09/23 (!) 152/92     Review of Systems See above   Past Medical History:  Diagnosis Date   Abdominal pain, chronic, epigastric 06/14/2014   Gallbladder polyp 08/05/2014   History of stomach ulcers    EGD 2010   Hyperlipidemia    Infectious colitis     Past Surgical History:  Procedure Laterality Date   NO PAST SURGERIES      Current Outpatient Medications  Medication Instructions   amLODipine (NORVASC) 10 mg, Oral, Daily   atorvastatin (LIPITOR) 40 mg, Oral, Daily at bedtime   gabapentin (NEURONTIN) 300 mg, Oral, 3 times daily PRN   LORazepam (ATIVAN) 0.5 MG tablet 1-2 tabs 30 - 60 min prior to MRI. Do not drive with this medicine.   losartan (COZAAR) 50 mg, Oral, Daily   meloxicam (MOBIC) 15 MG tablet One tab PO qAM with breakfast for 2 weeks, then daily prn pain.   predniSONE (DELTASONE) 50 MG tablet Take 1 pill daily for 5 days   tiZANidine (ZANAFLEX) 4 mg, Oral, 2 times daily PRN       Objective:   Physical Exam BP (!) 146/100   Pulse 87   Temp 97.9 F (36.6 C) (Oral)   Resp 16   Ht 5\' 4"  (1.626 m)   Wt 193 lb 4 oz (87.7 kg)   SpO2 97%   BMI 33.17 kg/m  General:   Well developed, NAD, BMI noted. HEENT:  Normocephalic . Face symmetric, atraumatic Lungs:  CTA B Normal respiratory effort, no intercostal retractions, no accessory muscle use. Heart: RRR,  no murmur.  Lower extremities: no pretibial edema bilaterally  Skin: Not pale. Not  jaundice Neurologic:  alert & oriented X3.  Speech normal, gait appropriate for age and unassisted Psych--  Cognition and judgment appear intact.  Cooperative with normal attention span and concentration.  Behavior appropriate. No anxious or depressed appearing.      Assessment   Assessment  Hyperglycemia Dyslipidemia HTN ( started amlodipine ~ 01/2019) Chronic epigastric pain --EGD C~2010 PUD per Pt --CT abd neg 2010 --EGD 08-2014 Dr Arlyce Dice: gastric erosions (bx chronic gastritis HP neg) , Rx PPI  --Gallbladder polyps per [place] 06-2014,multiple f/u US, next 02-2023 --Fatty liver per [place] 06-2014, normal LFTs  PLAN: HTN: In December, losartan was added to amlodipine for better BP control, subsequent BMP okay.  He ran out of amlodipine a month ago, BP has been consistently elevated but asymptomatic. Plan: Advised patient not to run out of medicine, if needed call directly to this office. RF amlodipine, continue losartan, nurse visit in 6 weeks, OV with me 3 months. He is on NSAIDs, recommend to take it with food and good hydration. All discussed with the patient in Spanish and he verbalized understanding.

## 2023-11-20 ENCOUNTER — Ambulatory Visit

## 2023-11-20 DIAGNOSIS — M25562 Pain in left knee: Secondary | ICD-10-CM | POA: Diagnosis not present

## 2023-11-20 DIAGNOSIS — G8929 Other chronic pain: Secondary | ICD-10-CM

## 2023-12-05 NOTE — Progress Notes (Signed)
 Left knee MRI shows a meniscus tear and medium to severe arthritis.  We will work on authorization for further injections.  Return once we get those authorized.

## 2023-12-23 ENCOUNTER — Ambulatory Visit

## 2024-01-03 ENCOUNTER — Other Ambulatory Visit: Payer: Self-pay | Admitting: Internal Medicine

## 2024-02-17 ENCOUNTER — Ambulatory Visit: Admitting: Internal Medicine

## 2024-02-17 ENCOUNTER — Encounter: Payer: Self-pay | Admitting: Internal Medicine

## 2024-02-17 VITALS — BP 120/80 | HR 67 | Temp 97.8°F | Resp 16 | Ht 64.0 in | Wt 193.1 lb

## 2024-02-17 DIAGNOSIS — E785 Hyperlipidemia, unspecified: Secondary | ICD-10-CM

## 2024-02-17 DIAGNOSIS — R739 Hyperglycemia, unspecified: Secondary | ICD-10-CM

## 2024-02-17 DIAGNOSIS — I10 Essential (primary) hypertension: Secondary | ICD-10-CM

## 2024-02-17 LAB — BASIC METABOLIC PANEL WITH GFR
BUN: 15 mg/dL (ref 6–23)
CO2: 29 meq/L (ref 19–32)
Calcium: 9.1 mg/dL (ref 8.4–10.5)
Chloride: 102 meq/L (ref 96–112)
Creatinine, Ser: 0.91 mg/dL (ref 0.40–1.50)
GFR: 96.09 mL/min (ref >60.00)
Glucose, Bld: 104 mg/dL — ABNORMAL HIGH (ref 70–99)
Potassium: 4 meq/L (ref 3.5–5.1)
Sodium: 138 meq/L (ref 135–145)

## 2024-02-17 LAB — HEMOGLOBIN A1C: Hgb A1c MFr Bld: 6.3 % (ref 4.6–6.5)

## 2024-02-17 MED ORDER — ATORVASTATIN CALCIUM 40 MG PO TABS: 40.0000 mg | ORAL_TABLET | Freq: Every day | ORAL | 1 refills | Status: AC

## 2024-02-17 NOTE — Progress Notes (Signed)
   Subjective:    Patient ID: Evan Freeman, male    DOB: 1971/03/06, 53 y.o.   MRN: [id number]  DOS:  02/17/2024 Type of visit - description: ROV  Since the last office visit is doing well. Chronic medical problems addressed. Denies chest pain or difficulty breathing. Still has occasional knee pain mostly with physical activity  Review of Systems See above   Past Medical History:  Diagnosis Date   Abdominal pain, chronic, epigastric 06/14/2014   Gallbladder polyp 08/05/2014   History of stomach ulcers    EGD 2010   Hyperlipidemia    Infectious colitis     Past Surgical History:  Procedure Laterality Date   NO PAST SURGERIES      Current Outpatient Medications  Medication Instructions   amLODipine  (NORVASC ) 10 mg, Oral, Daily   atorvastatin  (LIPITOR) 40 mg, Oral, Daily at bedtime   gabapentin  (NEURONTIN ) 300 mg, Oral, 3 times daily PRN   losartan  (COZAAR ) 50 mg, Oral, Daily       Objective:   Physical Exam BP 120/80   Pulse 67   Temp 97.8 F (36.6 C) (Oral)   Resp 16   Ht 5' 4 (1.626 m)   Wt 193 lb 2 oz (87.6 kg)   SpO2 96%   BMI 33.15 kg/m  General:   Well developed, NAD, BMI noted. HEENT:  Normocephalic . Face symmetric, atraumatic Lower extremities: no pretibial edema bilaterally  Skin: Not pale. Not jaundice Neurologic:  alert & oriented X3.  Speech normal, gait appropriate for age and unassisted Psych--  Cognition and judgment appear intact.  Cooperative with normal attention span and concentration.  Behavior appropriate. No anxious or depressed appearing.      Assessment     Assessment  Hyperglycemia Dyslipidemia HTN ( started amlodipine  ~ 01/2019) Chronic epigastric pain --EGD C~2010 PUD per Pt --CT abd neg 2010 --EGD 08-2014 Dr Arvie Latus: gastric erosions (bx chronic gastritis HP neg) , Rx PPI  --Gallbladder polyps per US  06-2014,multiple f/u US , next 02-2023 --Fatty liver per US  06-2014, normal LFTs  PLAN: HTN: On losartan ,  amlodipine , no ambulatory BPs, BP slightly high when he arrived, recheck 120/80.  Plan: Continue present care, encourage compliance, check BMP Hyperglycemia: Check A1c Dyslipidemia: pt is not sure if she is taking atorvastatin .  Rx sent.  Encourage compliance. RTC 3 months CPX

## 2024-02-17 NOTE — Assessment & Plan Note (Signed)
 HTN: On losartan , amlodipine , no ambulatory BPs, BP slightly high when he arrived, recheck 120/80.  Plan: Continue present care, encourage compliance, check BMP Hyperglycemia: Check A1c Dyslipidemia: pt is not sure if she is taking atorvastatin .  Rx sent.  Encourage compliance. RTC 3 months CPX

## 2024-02-17 NOTE — Patient Instructions (Signed)
   Check the  blood pressure regularly Blood pressure goal:  between 110/65 and  135/85. If it is consistently higher or lower, let me know     GO TO THE LAB :  Get the blood work   Your results will be posted on MyChart with my comments  Next office visit for a physical exam in 3 months Please make an appointment before you leave today

## 2024-02-19 ENCOUNTER — Ambulatory Visit: Payer: Self-pay | Admitting: Internal Medicine

## 2024-06-01 ENCOUNTER — Encounter: Payer: Self-pay | Admitting: Internal Medicine

## 2024-06-01 ENCOUNTER — Ambulatory Visit (INDEPENDENT_AMBULATORY_CARE_PROVIDER_SITE_OTHER): Admitting: Internal Medicine

## 2024-06-01 VITALS — BP 136/80 | HR 69 | Temp 98.0°F | Resp 16 | Ht 64.0 in | Wt 195.5 lb

## 2024-06-01 DIAGNOSIS — R739 Hyperglycemia, unspecified: Secondary | ICD-10-CM

## 2024-06-01 DIAGNOSIS — Z0001 Encounter for general adult medical examination with abnormal findings: Secondary | ICD-10-CM | POA: Diagnosis not present

## 2024-06-01 DIAGNOSIS — Z23 Encounter for immunization: Secondary | ICD-10-CM

## 2024-06-01 DIAGNOSIS — E785 Hyperlipidemia, unspecified: Secondary | ICD-10-CM

## 2024-06-01 DIAGNOSIS — Z0000 Encounter for general adult medical examination without abnormal findings: Secondary | ICD-10-CM

## 2024-06-01 DIAGNOSIS — K824 Cholesterolosis of gallbladder: Secondary | ICD-10-CM

## 2024-06-01 DIAGNOSIS — I10 Essential (primary) hypertension: Secondary | ICD-10-CM

## 2024-06-01 DIAGNOSIS — M791 Myalgia, unspecified site: Secondary | ICD-10-CM

## 2024-06-01 LAB — BASIC METABOLIC PANEL WITH GFR
BUN: 14 mg/dL (ref 6–23)
CO2: 27 meq/L (ref 19–32)
Calcium: 9 mg/dL (ref 8.4–10.5)
Chloride: 103 meq/L (ref 96–112)
Creatinine, Ser: 0.82 mg/dL (ref 0.40–1.50)
GFR: 99.94 mL/min (ref >60.00)
Glucose, Bld: 105 mg/dL — ABNORMAL HIGH (ref 70–99)
Potassium: 4 meq/L (ref 3.5–5.1)
Sodium: 138 meq/L (ref 135–145)

## 2024-06-01 LAB — LIPID PANEL
Cholesterol: 188 mg/dL (ref 0–200)
HDL: 33.6 mg/dL — ABNORMAL LOW (ref >39.00)
LDL Cholesterol: 118 mg/dL — ABNORMAL HIGH (ref 0–99)
NonHDL: 154.41
Total CHOL/HDL Ratio: 6
Triglycerides: 184 mg/dL — ABNORMAL HIGH (ref 0.0–149.0)
VLDL: 36.8 mg/dL (ref 0.0–40.0)

## 2024-06-01 LAB — AST: AST: 24 U/L (ref 0–37)

## 2024-06-01 LAB — CBC WITH DIFFERENTIAL/PLATELET
Basophils Absolute: 0 K/uL (ref 0.0–0.1)
Basophils Relative: 0.5 % (ref 0.0–3.0)
Eosinophils Absolute: 0.2 K/uL (ref 0.0–0.7)
Eosinophils Relative: 3.2 % (ref 0.0–5.0)
HCT: 43.4 % (ref 39.0–52.0)
Hemoglobin: 14.2 g/dL (ref 13.0–17.0)
Lymphocytes Relative: 36.1 % (ref 12.0–46.0)
Lymphs Abs: 2.7 K/uL (ref 0.7–4.0)
MCHC: 32.8 g/dL (ref 30.0–36.0)
MCV: 82.8 fl (ref 78.0–100.0)
Monocytes Absolute: 0.5 K/uL (ref 0.1–1.0)
Monocytes Relative: 6.2 % (ref 3.0–12.0)
Neutro Abs: 4 K/uL (ref 1.4–7.7)
Neutrophils Relative %: 54 % (ref 43.0–77.0)
Platelets: 203 K/uL (ref 150.0–400.0)
RBC: 5.24 Mil/uL (ref 4.22–5.81)
RDW: 14.7 % (ref 11.5–15.5)
WBC: 7.4 K/uL (ref 4.0–10.5)

## 2024-06-01 LAB — ALT: ALT: 33 U/L (ref 0–53)

## 2024-06-01 LAB — PSA: PSA: 0.61 ng/mL (ref 0.10–4.00)

## 2024-06-01 LAB — HEMOGLOBIN A1C: Hgb A1c MFr Bld: 6.2 % (ref 4.6–6.5)

## 2024-06-01 NOTE — Patient Instructions (Addendum)
 GO TO THE LAB :  Get the blood work    Then, go to the front desk for the checkout Schedule an appointment for a nurse visit in 3 months to get your second shingles shot (culebrilla) Please make an appointment for a checkup in 6 months   STOP BY THE FIRST FLOOR: Arrange for a gallbladder ultrasound  Start checking your blood pressure at the pharmacy once or twice a month blood pressure goal:  between 110/65 and  135/85. If it is consistently higher or lower, let me know  Vaccines I recommend: Flu shot COVID booster  Please read more detailed instructions below       WORK-RELATED MUSCULOSKELETAL PAIN AND FATIGUE: You have been experiencing generalized body pain and fatigue after work, especially in your arms, for the past two months. These symptoms improve with rest and are not present on non-working days. -Stay hydrated and use Tylenol  or ibuprofen as needed for pain. Take ibuprofen with food to avoid stomach upset and limit its use to once daily.  HYPERTENSION: Your blood pressure is generally well-controlled with your current medications, but you had an elevated reading after missing a dose. -Continue taking Amlodipine  10 mg orally daily. -Check your blood pressure occasionally at the pharmacy.  HYPERLIPIDEMIA: Your cholesterol levels are being managed with atorvastatin , and there are no new concerns. -Continue taking Atorvastatin  40 mg orally at bedtime. -A fasting lipid panel (FLP) has been ordered to monitor your cholesterol levels.  HYPERGLYCEMIA: We need to monitor your blood sugar levels to assess control. -A hemoglobin A1c test has been ordered.  GALLBLADDER POLYP: You have a previously identified gallbladder polyp that requires monitoring. -An ultrasound has been ordered to evaluate the gallbladder polyp.

## 2024-06-01 NOTE — Progress Notes (Signed)
 Subjective:    Patient ID: Evan Freeman, male    DOB: [birth year], 53 y.o.   MRN: [id number]  DOS:  06/01/2024 Type of visit - description:   Discussed the use of AI scribe software for clinical note transcription with the patient, who gave verbal consent to proceed.  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 - Symptoms more pronounced after heavy physical labor - Pain affects entire body, with emphasis on arms - Improvement in symptoms after resting overnight - Occasional use of Advil provides relief  Hypertension management - Current antihypertensive medications: losartan  and amlodipine  - Occasionally monitors blood pressure at home - Recent elevated blood pressure reading after missing a dose - Otherwise, home blood pressure readings are within normal range  Associated symptoms and review of systems - No chest pain, shortness of breath, nausea, vomiting, diarrhea, blood in stool, blood in urine, fever, headache, or weight loss     Review of Systems  Other than above, a 14 point review of systems is negative     Past Medical History:  Diagnosis Date   Abdominal pain, chronic, epigastric 06/14/2014   Gallbladder polyp 08/05/2014   History of stomach ulcers    EGD 2010   Hyperlipidemia     Past Surgical History:  Procedure Laterality Date   NO PAST SURGERIES      Current Outpatient Medications  Medication Instructions   amLODipine  (NORVASC ) 10 mg, Oral, Daily   atorvastatin  (LIPITOR) 40 mg, Oral, Daily at bedtime   losartan  (COZAAR ) 50 mg, Oral, Daily       Objective:   Physical Exam BP 136/80   Pulse 69   Temp 98 F (36.7 C) (Oral)   Resp 16   Ht 5' 4 (1.626 m)   Wt 195 lb 8 oz (88.7 kg)   SpO2 97%   BMI 33.56 kg/m  General: Well developed, NAD, BMI noted Neck: No  thyromegaly  HEENT:  Normocephalic . Face symmetric,  atraumatic Lungs:  CTA B Normal respiratory effort, no intercostal retractions, no accessory muscle use. Heart: RRR,  no murmur.  Abdomen:  Not distended, soft, non-tender. No rebound or rigidity.   Lower extremities: no pretibial edema bilaterally  Skin: Exposed areas without rash. Not pale. Not jaundice Neurologic:  alert & oriented X3.  Speech normal, gait appropriate for age and unassisted Strength symmetric and appropriate for age.  Psych: Cognition and judgment appear intact.  Cooperative with normal attention span and concentration.  Behavior appropriate. No anxious or depressed appearing.     Assessment     Assessment  Hyperglycemia Dyslipidemia HTN ( started amlodipine  ~ 01/2019) Chronic epigastric pain --EGD C~2010 PUD per Pt --CT abd neg 2010 --EGD 08-2014 Dr Debrah: gastric erosions (bx chronic gastritis HP neg) , Rx PPI  --Gallbladder polyps per US  06-2014,multiple f/u US , next 02-2023 --Fatty liver per US  06-2014, normal LFTs  Assessment & Plan Here for CPX -Td 2023 -Vaccines are recommended: Shingrix #1 today, next in 3 months. Recommend a flu shot every fall, rec a COVID-vaccine   -Colon cancer screening: No FH, colonoscopy 05-2022, next per GI. -Prostate cancer screening: no FH,  checking a PSA -Lifestyle: He is very active, encouraged healthy diet. -Labs:BMP AST ALT FLP CBC A1c PSA  Other issues: Myalgias, fatigue: Generalized body pain and fatigue after work, particularly affecting the arms-legs, present for two months. Symptoms improve with rest and are absent  on non-working days. No fever, headache, or weight loss. 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 - Advise hydration and use of Tylenol  or ibuprofen as needed for pain, with ibuprofen taken with food to avoid stomach upset. - Limit ibuprofen to once daily.    Hypertension Controlled, continue amlodipine . Start checking BPs at the pharmacy, goals  provided Hyperlipidemia Currently managed with atorvastatin . No new concerns noted.  Check FLP Hyperglycemia: Check A1c  gallbladder polyp Previously identified gallbladder polyp requires monitoring. - Order ultrasound to evaluate gallbladder polyp. RTC 3 months for second shingles shot. RTC checkup 6 months All instructions printed in English and discussed in Spanish, he verbalized understanding

## 2024-06-01 NOTE — Assessment & Plan Note (Signed)
 Here for CPX -Td 2023 -Vaccines are recommended: Shingrix #1 today, next in 3 months. Recommend a flu shot every fall, rec a COVID-vaccine   -Colon cancer screening: No FH, colonoscopy 05-2022, next per GI. -Prostate cancer screening: no FH,  checking a PSA -Lifestyle: He is very active, encouraged healthy diet. -Labs:BMP AST ALT FLP CBC A1c PSA

## 2024-06-01 NOTE — Assessment & Plan Note (Signed)
 Here for CPX   Other issues: Myalgias, fatigue: Generalized body pain and fatigue after work, particularly affecting the arms-legs, present for two months. Symptoms improve with rest and are absent on non-working days. No fever, headache, or weight loss. 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 - Advise hydration and use of Tylenol  or ibuprofen as needed for pain, with ibuprofen taken with food to avoid stomach upset. - Limit ibuprofen to once daily.    Hypertension Controlled, continue amlodipine . Start checking BPs at the pharmacy, goals provided Hyperlipidemia Currently managed with atorvastatin . No new concerns noted.  Check FLP Hyperglycemia: Check A1c  gallbladder polyp Previously identified gallbladder polyp requires monitoring. - Order ultrasound to evaluate gallbladder polyp. RTC 3 months for second shingles shot. RTC checkup 6 months All instructions printed in English and discussed in Spanish, he verbalized understanding

## 2024-06-02 ENCOUNTER — Ambulatory Visit (HOSPITAL_BASED_OUTPATIENT_CLINIC_OR_DEPARTMENT_OTHER)
Admission: RE | Admit: 2024-06-02 | Discharge: 2024-06-02 | Disposition: A | Source: Ambulatory Visit | Attending: Internal Medicine | Admitting: Internal Medicine

## 2024-06-02 DIAGNOSIS — K824 Cholesterolosis of gallbladder: Secondary | ICD-10-CM | POA: Insufficient documentation

## 2024-06-04 ENCOUNTER — Ambulatory Visit: Payer: Self-pay | Admitting: Internal Medicine

## 2024-06-04 DIAGNOSIS — K824 Cholesterolosis of gallbladder: Secondary | ICD-10-CM

## 2024-06-07 NOTE — Addendum Note (Signed)
 Addended by: Kyshawn Teal D on: 06/07/2024 02:07 PM   Modules accepted: Orders

## 2024-06-15 ENCOUNTER — Ambulatory Visit: Payer: Self-pay | Admitting: General Surgery

## 2024-06-15 NOTE — H&P (Signed)
 Chief Complaint: New Consultation     History of Present Illness: Evan Freeman is a 53 y.o. male who is seen today as an office consultation at the request of Dr. Amon for evaluation of New Consultation .   History of Present Illness Evan Freeman is a 53 year old male who presents for surgical evaluation of gallbladder polyps. He was referred by his primary care doctor for evaluation of gallbladder polyps.  His gallbladder has been monitored annually for polyps over the past four years. This year's ultrasound shows multiple polyps, whereas previous ultrasounds showed only one polyp. He experiences no pain under the ribs or any other abdominal pain. He has not undergone any previous abdominal surgeries.  He has hypertension. He does not have diabetes, heart, or lung problems and is not under cardiology care.  He works in [employer] but does not frequently lift heavy objects.   US  results:  IMPRESSION: 1. Possible multiple gallbladder polyps demonstrated on today's exam, largest measuring up to 7 mm. Comparison is difficult with prior exams given limited evaluation of the gallbladder secondary to habitus. Recommend follow-up right upper quadrant ultrasound in 6 months. 2. Increased hepatic parenchymal echogenicity suggestive of steatosis.   Review of Systems: A complete review of systems was obtained from the patient.  I have reviewed this information and discussed as appropriate with the patient.  See HPI as well for other ROS.  Review of Systems  Constitutional:  Negative for fever.  HENT:  Negative for congestion.   Eyes:  Negative for blurred vision.  Respiratory:  Negative for cough, shortness of breath and wheezing.   Cardiovascular:  Negative for chest pain and palpitations.  Gastrointestinal:  Negative for heartburn.  Genitourinary:  Negative for dysuria.  Musculoskeletal:  Negative for myalgias.  Skin:  Negative for rash.  Neurological:  Negative  for dizziness and headaches.  Psychiatric/Behavioral:  Negative for depression and suicidal ideas.   All other systems reviewed and are negative.     Medical History: Past Medical History: Diagnosis Date  Arthritis   GERD (gastroesophageal reflux disease)   Hyperlipidemia    There is no problem list on file for this patient.   History reviewed. No pertinent surgical history.   No Known Allergies  Current Outpatient Medications on File Prior to Visit Medication Sig Dispense Refill  amLODIPine  (NORVASC ) 10 MG tablet Take 10 mg by mouth once daily    atorvastatin  (LIPITOR) 40 MG tablet Take 40 mg by mouth at bedtime    losartan  (COZAAR ) 50 MG tablet Take 50 mg by mouth once daily    No current facility-administered medications on file prior to visit.   Family History Problem Relation Age of Onset  High blood pressure (Hypertension) Mother   Heart valve disease Mother   Diabetes Mother     Social History  Tobacco Use Smoking Status Former  Types: Cigarettes Smokeless Tobacco Never    Social History  Socioeconomic History  Marital status: Married Tobacco Use  Smoking status: Former   Types: Cigarettes  Smokeless tobacco: Never Substance and Sexual Activity  Alcohol use: Yes   Alcohol/week: 4.0 - 14.0 standard drinks of alcohol   Types: 4 - 14 Standard drinks or equivalent per week  Drug use: Never  Social Drivers of Health   Received from [employer]  Social Network Housing Stability: Unknown (06/15/2024)  Housing Stability Vital Sign   Homeless in the Last Year: No   Objective:   Vitals:  06/15/24 0850 BP: 136/87 Pulse: 85  Temp: 36.9 C (98.4 F) SpO2: 96% Weight: 88.5 kg (195 lb 3.2 oz) Height: 162.6 cm (5' 4) PainSc: 0-No pain   Body mass index is 33.51 kg/m.  Physical Exam Constitutional:      General: He is not in acute distress.    Appearance: Normal appearance.  HENT:     Head: Normocephalic.     Nose: No rhinorrhea.      Mouth/Throat:     Mouth: Mucous membranes are moist.     Pharynx: Oropharynx is clear.  Eyes:     General: No scleral icterus.    Pupils: Pupils are equal, round, and reactive to light.  Cardiovascular:     Rate and Rhythm: Normal rate.     Pulses: Normal pulses.  Pulmonary:     Effort: Pulmonary effort is normal. No respiratory distress.     Breath sounds: No stridor. No wheezing.  Abdominal:     General: Abdomen is flat. There is no distension.     Tenderness: There is no abdominal tenderness. There is no guarding or rebound.  Musculoskeletal:        General: Normal range of motion.     Cervical back: Normal range of motion and neck supple.  Skin:    General: Skin is warm and dry.     Capillary Refill: Capillary refill takes less than 2 seconds.     Coloration: Skin is not jaundiced.  Neurological:     General: No focal deficit present.     Mental Status: He is alert and oriented to person, place, and time. Mental status is at baseline.  Psychiatric:        Mood and Affect: Mood normal.        Thought Content: Thought content normal.        Judgment: Judgment normal.      Assessment and Plan: Diagnoses and all orders for this visit:  Gallbladder polyp    Evan Freeman is a 53 y.o. male   1.  We will proceed to the OR for a lap cholecystectomy. 2. All risks and benefits were discussed with the patient to generally include: infection, bleeding, possible need for post op ERCP, damage to the bile ducts, and bile leak. Alternatives were offered and described.  All questions were answered and the patient voiced understanding of the procedure and wishes to proceed at this point with a laparoscopic cholecystectomy      No follow-ups on file.  Lynda Leos, MD, [facility], [place] [facility]

## 2024-07-05 ENCOUNTER — Encounter (HOSPITAL_COMMUNITY): Payer: Self-pay

## 2024-07-05 NOTE — Progress Notes (Signed)
 Surgical Instructions   Your procedure is scheduled on Tuesday, November 11th, 2025. Report to [facility] Main Entrance A at 9:15 A.M., then check in with the Admitting office. Any questions or running late day of surgery: call [phone]  Questions prior to your surgery date: call [phone], Monday-Friday, 8am-4pm. If you experience any cold or flu symptoms such as cough, fever, chills, shortness of breath, etc. between now and your scheduled surgery, please notify us  at the above number.     Remember:  Do not eat after midnight the night before your surgery   You may drink clear liquids until 8:15 the morning of your surgery.   Clear liquids allowed are: Water, Non-Citrus Juices (without pulp), Carbonated Beverages, Clear Tea (no milk, honey, etc.), Black Coffee Only (NO MILK, CREAM OR POWDERED CREAMER of any kind), and Gatorade.  Patient Instructions  The night before surgery:  No food after midnight. ONLY clear liquids after midnight  The day of surgery (if you do NOT have diabetes):  Drink ONE (1) Pre-Surgery Clear Ensure by 8:15 the morning of surgery. Drink in one sitting. Do not sip.  This drink was given to you during your hospital  pre-op appointment visit.  Nothing else to drink after completing the  Pre-Surgery Clear Ensure.         If you have questions, please contact your surgeon's office.     Take these medicines the morning of surgery with A SIP OF WATER: Amlodipine  (Norvasc ) Atorvastatin  (Lipitor)   May take these medicines IF NEEDED: Acetaminophen  (Tylenol )    One week prior to surgery, STOP taking any Aspirin (unless otherwise instructed by your surgeon) Aleve, Naproxen, Ibuprofen, Motrin, Advil, Goody's, BC's, all herbal medications, fish oil, and non-prescription vitamins.                     Do NOT Smoke (Tobacco/Vaping) for 24 hours prior to your procedure.  If you use a CPAP at night, you may bring your mask/headgear for your overnight  stay.   You will be asked to remove any contacts, glasses, piercing's, hearing aid's, dentures/partials prior to surgery. Please bring cases for these items if needed.    Patients discharged the day of surgery will not be allowed to drive home, and someone needs to stay with them for 24 hours.  SURGICAL WAITING ROOM VISITATION Patients may have no more than 2 support people in the waiting area - these visitors may rotate.   Pre-op nurse will coordinate an appropriate time for 1 ADULT support person, who may not rotate, to accompany patient in pre-op.  Children under the age of 25 must have an adult with them who is not the patient and must remain in the main waiting area with an adult.  If the patient needs to stay at the hospital during part of their recovery, the visitor guidelines for inpatient rooms apply.  Please refer to the [facility] website for the visitor guidelines for any additional information.   If you received a COVID test during your pre-op visit  it is requested that you wear a mask when out in public, stay away from anyone that may not be feeling well and notify your surgeon if you develop symptoms. If you have been in contact with anyone that has tested positive in the last 10 days please notify you surgeon.      Pre-operative CHG Bathing Instructions   You can play a key role in reducing the risk of infection  after surgery. Your skin needs to be as free of germs as possible. You can reduce the number of germs on your skin by washing with CHG (chlorhexidine gluconate) soap before surgery. CHG is an antiseptic soap that kills germs and continues to kill germs even after washing.   DO NOT use if you have an allergy to chlorhexidine/CHG or antibacterial soaps. If your skin becomes reddened or irritated, stop using the CHG and notify one of our RNs at [phone].              TAKE A SHOWER THE NIGHT BEFORE SURGERY   Please keep in mind the following:  DO NOT shave,  including legs and underarms, 48 hours prior to surgery.   You may shave your face before/day of surgery.  Place clean sheets on your bed the night before surgery Use a clean washcloth (not used since being washed) for shower. DO NOT sleep with pet's night before surgery.  CHG Shower Instructions:  Wash your face and private area with normal soap. If you choose to wash your hair, wash first with your normal shampoo.  After you use shampoo/soap, rinse your hair and body thoroughly to remove shampoo/soap residue.  Turn the water OFF and apply half the bottle of CHG soap to a CLEAN washcloth.  Apply CHG soap ONLY FROM YOUR NECK DOWN TO YOUR TOES (washing for 3-5 minutes)  DO NOT use CHG soap on face, private areas, open wounds, or sores.  Pay special attention to the area where your surgery is being performed.  If you are having back surgery, having someone wash your back for you may be helpful. Wait 2 minutes after CHG soap is applied, then you may rinse off the CHG soap.  Pat dry with a clean towel  Put on clean pajamas    Additional instructions for the day of surgery: If you choose, you may shower the morning of surgery with an antibacterial soap.  DO NOT APPLY any lotions, deodorants, cologne, or perfumes.   Do not wear jewelry or makeup Do not wear nail polish, gel polish, artificial nails, or any other type of covering on natural nails (fingers and toes) Do not bring valuables to the hospital. [facility] is not responsible for valuables/personal belongings. Put on clean/comfortable clothes.  Please brush your teeth.  Ask your nurse before applying any prescription medications to the skin.

## 2024-07-06 ENCOUNTER — Encounter (HOSPITAL_COMMUNITY): Payer: Self-pay

## 2024-07-06 ENCOUNTER — Other Ambulatory Visit: Payer: Self-pay

## 2024-07-06 ENCOUNTER — Encounter (HOSPITAL_COMMUNITY)
Admission: RE | Admit: 2024-07-06 | Discharge: 2024-07-06 | Disposition: A | Discharge status: Home / Self Care | Source: Ambulatory Visit | Attending: General Surgery | Admitting: General Surgery

## 2024-07-06 VITALS — BP 138/87 | HR 66 | Temp 98.1°F | Resp 17 | Ht 64.0 in | Wt 194.3 lb

## 2024-07-06 DIAGNOSIS — R9431 Abnormal electrocardiogram [ECG] [EKG]: Secondary | ICD-10-CM | POA: Insufficient documentation

## 2024-07-06 DIAGNOSIS — Z01818 Encounter for other preprocedural examination: Secondary | ICD-10-CM | POA: Diagnosis present

## 2024-07-06 DIAGNOSIS — I10 Essential (primary) hypertension: Secondary | ICD-10-CM | POA: Diagnosis not present

## 2024-07-06 DIAGNOSIS — K769 Liver disease, unspecified: Secondary | ICD-10-CM | POA: Diagnosis not present

## 2024-07-06 HISTORY — DX: Unspecified osteoarthritis, unspecified site: M19.90

## 2024-07-06 HISTORY — DX: Essential (primary) hypertension: I10

## 2024-07-06 HISTORY — DX: Fatty (change of) liver, not elsewhere classified: K76.0

## 2024-07-06 LAB — CBC
HCT: 45.9 % (ref 39.0–52.0)
Hemoglobin: 14.8 g/dL (ref 13.0–17.0)
MCH: 26.8 pg (ref 26.0–34.0)
MCHC: 32.2 g/dL (ref 30.0–36.0)
MCV: 83.2 fL (ref 80.0–100.0)
Platelets: 248 K/uL (ref 150–400)
RBC: 5.52 MIL/uL (ref 4.22–5.81)
RDW: 13.5 % (ref 11.5–15.5)
WBC: 8.5 K/uL (ref 4.0–10.5)
nRBC: 0 % (ref 0.0–0.2)

## 2024-07-06 LAB — COMPREHENSIVE METABOLIC PANEL WITH GFR
ALT: 38 U/L (ref 0–44)
AST: 27 U/L (ref 15–41)
Albumin: 3.9 g/dL (ref 3.5–5.0)
Alkaline Phosphatase: 71 U/L (ref 38–126)
Anion gap: 7 (ref 5–15)
BUN: 10 mg/dL (ref 6–20)
CO2: 27 mmol/L (ref 22–32)
Calcium: 8.8 mg/dL — ABNORMAL LOW (ref 8.9–10.3)
Chloride: 103 mmol/L (ref 98–111)
Creatinine, Ser: 0.92 mg/dL (ref 0.61–1.24)
GFR, Estimated: >60 mL/min (ref >60)
Glucose, Bld: 107 mg/dL — ABNORMAL HIGH (ref 70–99)
Potassium: 3.9 mmol/L (ref 3.5–5.1)
Sodium: 137 mmol/L (ref 135–145)
Total Bilirubin: 0.7 mg/dL (ref 0.0–1.2)
Total Protein: 7.1 g/dL (ref 6.5–8.1)

## 2024-07-06 NOTE — Progress Notes (Signed)
   07/06/24 0809  OBSTRUCTIVE SLEEP APNEA  Have you ever been diagnosed with sleep apnea through a sleep study? No  Do you snore loudly (loud enough to be heard through closed doors)?  1  Do you often feel tired, fatigued, or sleepy during the daytime (such as falling asleep during driving or talking to someone)? 1  Has anyone observed you stop breathing during your sleep? 0  Do you have, or are you being treated for high blood pressure? 1  BMI more than 35 kg/m2? 0  Age > 50 (1-yes) 1  Neck circumference greater than:Male 16 inches or larger, Male 17inches or larger? 1  Male Gender (Yes=1) 1  Obstructive Sleep Apnea Score 6  Score 5 or greater  Results sent to PCP

## 2024-07-06 NOTE — Progress Notes (Signed)
 PCP - [facility] Cardiologist - denies  PPM/ICD - denies   Chest x-ray - N/A EKG - 07/06/2024  Stress Test - denies ECHO - denies Cardiac Cath - denies  Sleep Study - stop bang 6   Fasting Blood Sugar - N/A pt is not DM   Last dose of GLP1 agonist-  N/A   Blood Thinner Instructions: N/A Aspirin Instructions: N/A  ERAS Protcol - ERAS + ensure   COVID TEST- N/A   Anesthesia review: no  Patient denies shortness of breath, fever, cough and chest pain at PAT appointment   All instructions explained to the patient, with a verbal understanding of the material. Patient agrees to go over the instructions while at home for a better understanding. Patient also instructed to self quarantine after being tested for COVID-19. The opportunity to ask questions was provided.

## 2024-07-10 ENCOUNTER — Encounter (HOSPITAL_COMMUNITY): Payer: Self-pay | Admitting: General Surgery

## 2024-07-10 ENCOUNTER — Other Ambulatory Visit: Payer: Self-pay

## 2024-07-10 ENCOUNTER — Ambulatory Visit (HOSPITAL_COMMUNITY)
Admission: RE | Admit: 2024-07-10 | Discharge: 2024-07-10 | Disposition: A | Discharge status: Home / Self Care | Attending: General Surgery | Admitting: General Surgery

## 2024-07-10 ENCOUNTER — Ambulatory Visit (HOSPITAL_COMMUNITY): Payer: Self-pay | Admitting: Anesthesiology

## 2024-07-10 ENCOUNTER — Encounter (HOSPITAL_COMMUNITY)
Admission: RE | Disposition: A | Discharge status: Home / Self Care | Payer: Self-pay | Source: Home / Self Care | Attending: General Surgery

## 2024-07-10 DIAGNOSIS — K811 Chronic cholecystitis: Secondary | ICD-10-CM | POA: Diagnosis not present

## 2024-07-10 DIAGNOSIS — Z79899 Other long term (current) drug therapy: Secondary | ICD-10-CM | POA: Diagnosis not present

## 2024-07-10 DIAGNOSIS — Z87891 Personal history of nicotine dependence: Secondary | ICD-10-CM | POA: Insufficient documentation

## 2024-07-10 DIAGNOSIS — K824 Cholesterolosis of gallbladder: Secondary | ICD-10-CM

## 2024-07-10 DIAGNOSIS — I10 Essential (primary) hypertension: Secondary | ICD-10-CM | POA: Insufficient documentation

## 2024-07-10 HISTORY — PX: CHOLECYSTECTOMY: SHX55

## 2024-07-10 SURGERY — LAPAROSCOPIC CHOLECYSTECTOMY
Anesthesia: General

## 2024-07-10 MED ORDER — PROPOFOL 10 MG/ML IV BOLUS
INTRAVENOUS | Status: AC
  Filled 2024-07-10: qty 20

## 2024-07-10 MED ORDER — MIDAZOLAM HCL 2 MG/2ML IJ SOLN
INTRAMUSCULAR | Status: AC
  Filled 2024-07-10: qty 2

## 2024-07-10 MED ORDER — ORAL CARE MOUTH RINSE: 15.0000 mL | Freq: Once | OROMUCOSAL | Status: AC

## 2024-07-10 MED ORDER — PHENYLEPHRINE 80 MCG/ML (10ML) SYRINGE FOR IV PUSH (FOR BLOOD PRESSURE SUPPORT)
PREFILLED_SYRINGE | INTRAVENOUS | Status: DC | PRN
  Administered 2024-07-10 (×2): 160 ug via INTRAVENOUS
  Administered 2024-07-10 (×2): 80 ug via INTRAVENOUS

## 2024-07-10 MED ORDER — OXYCODONE HCL 5 MG/5ML PO SOLN
5.0000 mg | Freq: Once | ORAL | Status: AC | PRN
  Administered 2024-07-10: 5 mg via ORAL

## 2024-07-10 MED ORDER — DEXAMETHASONE SOD PHOSPHATE PF 10 MG/ML IJ SOLN
INTRAMUSCULAR | Status: DC | PRN
  Administered 2024-07-10: 10 mg via INTRAVENOUS

## 2024-07-10 MED ORDER — PROPOFOL 10 MG/ML IV BOLUS
INTRAVENOUS | Status: DC | PRN
  Administered 2024-07-10: 200 mg via INTRAVENOUS

## 2024-07-10 MED ORDER — ENSURE PRE-SURGERY PO LIQD: 296.0000 mL | Freq: Once | ORAL | Status: DC

## 2024-07-10 MED ORDER — FENTANYL CITRATE (PF) 250 MCG/5ML IJ SOLN
INTRAMUSCULAR | Status: DC | PRN
  Administered 2024-07-10 (×4): 50 ug via INTRAVENOUS

## 2024-07-10 MED ORDER — LACTATED RINGERS IV SOLN: INTRAVENOUS | Status: DC

## 2024-07-10 MED ORDER — CHLORHEXIDINE GLUCONATE CLOTH 2 % EX PADS: 6.0000 | MEDICATED_PAD | Freq: Once | CUTANEOUS | Status: DC

## 2024-07-10 MED ORDER — MIDAZOLAM HCL (PF) 2 MG/2ML IJ SOLN
INTRAMUSCULAR | Status: DC | PRN
  Administered 2024-07-10: 2 mg via INTRAVENOUS

## 2024-07-10 MED ORDER — SUCCINYLCHOLINE CHLORIDE 200 MG/10ML IV SOSY
PREFILLED_SYRINGE | INTRAVENOUS | Status: AC
Start: 2024-07-10 — End: 2024-07-10
  Filled 2024-07-10: qty 10

## 2024-07-10 MED ORDER — ROCURONIUM BROMIDE 10 MG/ML (PF) SYRINGE
PREFILLED_SYRINGE | INTRAVENOUS | Status: DC | PRN
  Administered 2024-07-10: 60 mg via INTRAVENOUS

## 2024-07-10 MED ORDER — KETOROLAC TROMETHAMINE 30 MG/ML IJ SOLN
INTRAMUSCULAR | Status: DC | PRN
  Administered 2024-07-10: 30 mg via INTRAVENOUS

## 2024-07-10 MED ORDER — CEFAZOLIN SODIUM-DEXTROSE 2-4 GM/100ML-% IV SOLN
2.0000 g | INTRAVENOUS | Status: AC
  Administered 2024-07-10: 2 g via INTRAVENOUS

## 2024-07-10 MED ORDER — ONDANSETRON HCL 4 MG/2ML IJ SOLN
INTRAMUSCULAR | Status: AC
Start: 2024-07-10 — End: 2024-07-10
  Filled 2024-07-10: qty 2

## 2024-07-10 MED ORDER — ONDANSETRON HCL 4 MG/2ML IJ SOLN
INTRAMUSCULAR | Status: DC | PRN
  Administered 2024-07-10: 4 mg via INTRAVENOUS

## 2024-07-10 MED ORDER — CEFAZOLIN SODIUM-DEXTROSE 2-4 GM/100ML-% IV SOLN
INTRAVENOUS | Status: AC
  Filled 2024-07-10: qty 100

## 2024-07-10 MED ORDER — CHLORHEXIDINE GLUCONATE 0.12 % MT SOLN: 15.0000 mL | Freq: Once | OROMUCOSAL | Status: AC

## 2024-07-10 MED ORDER — CHLORHEXIDINE GLUCONATE 0.12 % MT SOLN
OROMUCOSAL | Status: AC
  Administered 2024-07-10: 15 mL via OROMUCOSAL
  Filled 2024-07-10: qty 15

## 2024-07-10 MED ORDER — ONDANSETRON HCL 4 MG/2ML IJ SOLN: 4.0000 mg | Freq: Once | INTRAMUSCULAR | Status: DC | PRN

## 2024-07-10 MED ORDER — ACETAMINOPHEN 500 MG PO TABS
ORAL_TABLET | ORAL | Status: AC
  Administered 2024-07-10: 1000 mg via ORAL
  Filled 2024-07-10: qty 2

## 2024-07-10 MED ORDER — ROCURONIUM BROMIDE 10 MG/ML (PF) SYRINGE
PREFILLED_SYRINGE | INTRAVENOUS | Status: AC
  Filled 2024-07-10: qty 10

## 2024-07-10 MED ORDER — BUPIVACAINE HCL 0.25 % IJ SOLN
INTRAMUSCULAR | Status: DC | PRN
Start: 2024-07-10 — End: 2024-07-10
  Administered 2024-07-10: 3 mL

## 2024-07-10 MED ORDER — PHENYLEPHRINE 80 MCG/ML (10ML) SYRINGE FOR IV PUSH (FOR BLOOD PRESSURE SUPPORT)
PREFILLED_SYRINGE | INTRAVENOUS | Status: AC
Start: 2024-07-10 — End: 2024-07-10
  Filled 2024-07-10: qty 10

## 2024-07-10 MED ORDER — MEPERIDINE HCL 25 MG/ML IJ SOLN: 6.2500 mg | INTRAMUSCULAR | Status: DC | PRN

## 2024-07-10 MED ORDER — SUGAMMADEX SODIUM 200 MG/2ML IV SOLN
INTRAVENOUS | Status: DC | PRN
  Administered 2024-07-10: 200 mg via INTRAVENOUS

## 2024-07-10 MED ORDER — FENTANYL CITRATE (PF) 100 MCG/2ML IJ SOLN
25.0000 ug | INTRAMUSCULAR | Status: DC | PRN
  Administered 2024-07-10: 50 ug via INTRAVENOUS
  Administered 2024-07-10: 25 ug via INTRAVENOUS

## 2024-07-10 MED ORDER — FENTANYL CITRATE (PF) 250 MCG/5ML IJ SOLN
INTRAMUSCULAR | Status: AC
  Filled 2024-07-10: qty 5

## 2024-07-10 MED ORDER — OXYCODONE HCL 5 MG/5ML PO SOLN
ORAL | Status: AC
  Filled 2024-07-10: qty 5

## 2024-07-10 MED ORDER — FENTANYL CITRATE (PF) 100 MCG/2ML IJ SOLN
INTRAMUSCULAR | Status: AC
  Filled 2024-07-10: qty 2

## 2024-07-10 MED ORDER — ACETAMINOPHEN 500 MG PO TABS: 1000.0000 mg | ORAL_TABLET | ORAL | Status: AC

## 2024-07-10 MED ORDER — TRAMADOL HCL 50 MG PO TABS: 50.0000 mg | ORAL_TABLET | Freq: Four times a day (QID) | ORAL | 0 refills | Status: AC | PRN

## 2024-07-10 MED ORDER — OXYCODONE HCL 5 MG PO TABS: 5.0000 mg | ORAL_TABLET | Freq: Once | ORAL | Status: AC | PRN

## 2024-07-10 MED ORDER — LIDOCAINE 2% (20 MG/ML) 5 ML SYRINGE
INTRAMUSCULAR | Status: DC | PRN
  Administered 2024-07-10: 100 mg via INTRAVENOUS

## 2024-07-10 SURGICAL SUPPLY — 35 items
BAG COUNTER SPONGE SURGICOUNT (BAG) ×1 IMPLANT
CANISTER SUCTION 3000ML PPV (SUCTIONS) ×1 IMPLANT
CHLORAPREP W/TINT 26 (MISCELLANEOUS) ×1 IMPLANT
CLIP LIGATING HEMO O LOK GREEN (MISCELLANEOUS) ×1 IMPLANT
COVER SURGICAL LIGHT HANDLE (MISCELLANEOUS) ×1 IMPLANT
COVER TRANSDUCER ULTRASND (DRAPES) ×1 IMPLANT
DERMABOND ADVANCED .7 DNX12 (GAUZE/BANDAGES/DRESSINGS) ×1 IMPLANT
ELECTRODE REM PT RTRN 9FT ADLT (ELECTROSURGICAL) ×1 IMPLANT
ENDOLOOP SUT PDS II 0 18 (SUTURE) IMPLANT
GLOVE BIO SURGEON STRL SZ7.5 (GLOVE) ×2 IMPLANT
GOWN STRL REUS W/ TWL LRG LVL3 (GOWN DISPOSABLE) ×2 IMPLANT
GOWN STRL REUS W/ TWL XL LVL3 (GOWN DISPOSABLE) ×1 IMPLANT
GRASPER SUT TROCAR 14GX15 (MISCELLANEOUS) ×1 IMPLANT
IRRIGATION SUCT STRKRFLW 2 WTP (MISCELLANEOUS) ×1 IMPLANT
KIT BASIN OR (CUSTOM PROCEDURE TRAY) ×1 IMPLANT
KIT IMAGING PINPOINTPAQ (MISCELLANEOUS) IMPLANT
KIT TURNOVER KIT B (KITS) ×1 IMPLANT
NDL INSUFFLATION 14GA 120MM (NEEDLE) ×1 IMPLANT
NEEDLE INSUFFLATION 14GA 120MM (NEEDLE) ×1 IMPLANT
PAD ARMBOARD POSITIONER FOAM (MISCELLANEOUS) ×1 IMPLANT
POUCH LAPAROSCOPIC INSTRUMENT (MISCELLANEOUS) ×1 IMPLANT
POUCH RETRIEVAL ECOSAC 10 (ENDOMECHANICALS) IMPLANT
SCISSORS LAP 5X35 DISP (ENDOMECHANICALS) ×1 IMPLANT
SET TUBE SMOKE EVAC HIGH FLOW (TUBING) ×1 IMPLANT
SLEEVE Z-THREAD 5X100MM (TROCAR) ×1 IMPLANT
SOLN 0.9% NACL POUR BTL 1000ML (IV SOLUTION) ×1 IMPLANT
SOLN STERILE WATER BTL 1000 ML (IV SOLUTION) ×1 IMPLANT
SUT MNCRL AB 4-0 PS2 18 (SUTURE) ×1 IMPLANT
SUT VICRYL 0 UR6 27IN ABS (SUTURE) IMPLANT
TOWEL GREEN STERILE (TOWEL DISPOSABLE) ×1 IMPLANT
TOWEL GREEN STERILE FF (TOWEL DISPOSABLE) ×1 IMPLANT
TRAY LAPAROSCOPIC MC (CUSTOM PROCEDURE TRAY) ×1 IMPLANT
TROCAR 11X100 Z THREAD (TROCAR) ×1 IMPLANT
TROCAR Z-THREAD OPTICAL 5X100M (TROCAR) ×1 IMPLANT
WARMER LAPAROSCOPE (MISCELLANEOUS) ×1 IMPLANT

## 2024-07-10 NOTE — Op Note (Signed)
 07/10/2024  11:44 AM  PATIENT:  Evan Freeman  53 y.o. male  PRE-OPERATIVE DIAGNOSIS:  GALLBLADDER Polyp  POST-OPERATIVE DIAGNOSIS:  GALLBLADDER POLYP, gallstones  PROCEDURE:  Procedure(s): LAPAROSCOPIC CHOLECYSTECTOMY (N/A)  SURGEON:  Surgeons and Role:    * Rubin Calamity, MD - Primary   ASSISTANTS: Waddell Collier, RNFA   ANESTHESIA:   local and general  EBL:  minimal   BLOOD ADMINISTERED:none  DRAINS: none   LOCAL MEDICATIONS USED:  BUPIVICAINE   SPECIMEN:  Source of Specimen:  gallbladder  DISPOSITION OF SPECIMEN:  PATHOLOGY  COUNTS:  YES  TOURNIQUET:  * No tourniquets in log *  DICTATION: .Dragon Dictation   EBL: <5cc   Complications: none   Counts: reported as correct x 2   Findings:chronically inflamed gallbladder with gallstones  Indications for procedure: Pt is a 71M with  US  showing GB polyp.  He was counseled and agreed to lap chole  Details of the procedure: The patient was taken to the operating and placed in the supine position with bilateral SCDs in place. A time out was called and all facts were verified. A pneumoperitoneum was obtained via A Veress needle technique to a pressure of 14mm of mercury. A 5mm trochar was then placed in the right upper quadrant under visualization, and there were no injuries to any abdominal organs. A 11 mm port was then placed in the umbilical region after infiltrating with local anesthesia under direct visualization. A second epigastric port was placed under direct visualization.   The gallbladder was identified and retracted, the peritoneum was then sharply dissected from the gallbladder and this dissection was carried down to Calot's triangle. The cystic duct was identified and dissected circumferentially and seen going into the gallbladder 360.  The cystic artery was dissected away from the surrounding tissues.   The critical angle was obtained.    2 clips were placed proximally one distally and the  cystic duct transected. The cystic artery was identified and 2 clips placed proximally and one distally and transected. We then proceeded to remove the gallbladder off the hepatic fossa with Bovie cautery. A retrieval bag was then placed in the abdomen and gallbladder placed in the bag. The hepatic fossa was then reexamined and hemostasis was achieved with Bovie cautery and was excellent at this portion of the case. The subhepatic fossa and perihepatic fossa was then irrigated until the effluent was clear. The specimen bag and specimen were removed from the abdominal cavity.  The 11 mm trocar fascia was reapproximated with the Endo Close #1 Vicryl x2. The pneumoperitoneum was evacuated and all trochars removed under direct visulalization. The skin was then closed with 4-0 Monocryl and the skin dressed with Dermabond. The patient was awaken from general anesthesia and taken to the recovery room in stable condition.    PLAN OF CARE: Discharge to home after PACU  PATIENT DISPOSITION:  PACU - hemodynamically stable.   Delay start of Pharmacological VTE agent (>24hrs) due to surgical blood loss or risk of bleeding: not applicable

## 2024-07-10 NOTE — Discharge Instructions (Signed)
 [facility] ______[facility] LAPAROSCOPIC SURGERY: POST OP INSTRUCTIONS Always review your discharge instruction sheet given to you by the facility where your surgery was performed. IF YOU HAVE DISABILITY OR FAMILY LEAVE FORMS, YOU MUST BRING THEM TO THE OFFICE FOR PROCESSING.   DO NOT GIVE THEM TO YOUR DOCTOR.  A prescription for pain medication may be given to you upon discharge.  Take your pain medication as prescribed, if needed.  If narcotic pain medicine is not needed, then you may take acetaminophen  (Tylenol ) or ibuprofen  (Advil ) as needed. Take your usually prescribed medications unless otherwise directed. If you need a refill on your pain medication, please contact your pharmacy.  They will contact our office to request authorization. Prescriptions will not be filled after 5pm or on week-ends. You should follow a light diet the first few days after arrival home, such as soup and crackers, etc.  Be sure to include lots of fluids daily. Most patients will experience some swelling and bruising in the area of the incisions.  Ice packs will help.  Swelling and bruising can take several days to resolve.  It is common to experience some constipation if taking pain medication after surgery.  Increasing fluid intake and taking a stool softener (such as Colace) will usually help or prevent this problem from occurring.  A mild laxative (Milk of Magnesia or Miralax) should be taken according to package instructions if there are no bowel movements after 48 hours. Unless discharge instructions indicate otherwise, you may remove your bandages 24-48 hours after surgery, and you may shower at that time.  You may have steri-strips (small skin tapes) in place directly over the incision.  These strips should be left on the skin for 7-10 days.  If your surgeon used skin glue on the incision, you may shower in 24 hours.  The glue will flake off over the next 2-3 weeks.  Any sutures or staples will be  removed at the office during your follow-up visit. ACTIVITIES:  You may resume regular (light) daily activities beginning the next day--such as daily self-care, walking, climbing stairs--gradually increasing activities as tolerated.  You may have sexual intercourse when it is comfortable.  Refrain from any heavy lifting or straining until approved by your doctor. You may drive when you are no longer taking prescription pain medication, you can comfortably wear a seatbelt, and you can safely maneuver your car and apply brakes. RETURN TO WORK:  __________________________________________________________ Rosine should see your doctor in the office for a follow-up appointment approximately 2-3 weeks after your surgery.  Make sure that you call for this appointment within a day or two after you arrive home to insure a convenient appointment time. OTHER INSTRUCTIONS: __________________________________________________________________________________________________________________________ __________________________________________________________________________________________________________________________ WHEN TO CALL YOUR DOCTOR: Fever over 101.0 Inability to urinate Continued bleeding from incision. Increased pain, redness, or drainage from the incision. Increasing abdominal pain  The clinic staff is available to answer your questions during regular business hours.  Please don't hesitate to call and ask to speak to one of the nurses for clinical concerns.  If you have a medical emergency, go to the nearest emergency room or call 911.  A surgeon from [facility] is always on call at the hospital. [address], [address], [place], [place]  [address] ? [address] [address], [place], [place]   [address] [phone] ? [phone] ? FAX [phone] Web site: [email or web address]

## 2024-07-10 NOTE — Anesthesia Postprocedure Evaluation (Signed)
 Anesthesia Post Note  Patient: Evan Freeman  Procedure(s) Performed: LAPAROSCOPIC CHOLECYSTECTOMY     Patient location during evaluation: PACU Anesthesia Type: General Level of consciousness: awake and alert Pain management: pain level controlled Vital Signs Assessment: post-procedure vital signs reviewed and stable Respiratory status: spontaneous breathing, nonlabored ventilation, respiratory function stable and patient connected to nasal cannula oxygen Cardiovascular status: blood pressure returned to baseline and stable Postop Assessment: no apparent nausea or vomiting Anesthetic complications: no   No notable events documented.  Last Vitals:  Vitals:   07/10/24 1245 07/10/24 1258  BP: 133/84   Pulse: 60 69  Resp: 11 18  Temp:    SpO2: 95% 95%    Last Pain:  Vitals:   07/10/24 1258  TempSrc:   PainSc: 8                  Bracken Moffa

## 2024-07-10 NOTE — Transfer of Care (Signed)
 Immediate Anesthesia Transfer of Care Note  Patient: Evan Freeman  Procedure(s) Performed: LAPAROSCOPIC CHOLECYSTECTOMY  Patient Location: PACU  Anesthesia Type:General  Level of Consciousness: awake  Airway & Oxygen Therapy: Patient Spontanous Breathing and Patient connected to face mask oxygen  Post-op Assessment: Report given to RN and Post -op Vital signs reviewed and stable  Post vital signs: Reviewed and stable  Last Vitals:  Vitals Value Taken Time  BP 124/79 07/10/24 12:07  Temp    Pulse 55 07/10/24 12:08  Resp 21 07/10/24 12:08  SpO2 92 % 07/10/24 12:08  Vitals shown include unfiled device data.  Last Pain:  Vitals:   07/10/24 0958  TempSrc:   PainSc: 0-No pain      Patients Stated Pain Goal: 1 (07/10/24 0949)  Complications: No notable events documented.

## 2024-07-10 NOTE — Anesthesia Preprocedure Evaluation (Addendum)
 Anesthesia Evaluation  Patient identified by MRN, date of birth, ID band Patient awake    Reviewed: Allergy & Precautions, H&P , NPO status , Patient's Chart, lab work & pertinent test results  Airway Mallampati: II  TM Distance: >3 FB Neck ROM: Full    Dental no notable dental hx. (+) Teeth Intact, Dental Advisory Given   Pulmonary neg pulmonary ROS   Pulmonary exam normal breath sounds clear to auscultation       Cardiovascular Exercise Tolerance: Good hypertension, Pt. on medications negative cardio ROS Normal cardiovascular exam Rhythm:Regular Rate:Normal     Neuro/Psych negative neurological ROS  negative psych ROS   GI/Hepatic negative GI ROS, Neg liver ROS,,,  Endo/Other  negative endocrine ROS    Renal/GU negative Renal ROS  negative genitourinary   Musculoskeletal negative musculoskeletal ROS (+) Arthritis ,    Abdominal   Peds negative pediatric ROS (+)  Hematology negative hematology ROS (+)   Anesthesia Other Findings   Reproductive/Obstetrics negative OB ROS                              Anesthesia Physical Anesthesia Plan  ASA: 2  Anesthesia Plan: General   Post-op Pain Management: Ofirmev  IV (intra-op)*   Induction: Intravenous  PONV Risk Score and Plan: 2 and Dexamethasone  and Ondansetron  Airway Management Planned: Oral ETT  Additional Equipment: None  Intra-op Plan:   Post-operative Plan: Extubation in OR  Informed Consent: I have reviewed the patients History and Physical, chart, labs and discussed the procedure including the risks, benefits and alternatives for the proposed anesthesia with the patient or authorized representative who has indicated his/her understanding and acceptance.       Plan Discussed with: Anesthesiologist and CRNA  Anesthesia Plan Comments: (  )         Anesthesia Quick Evaluation

## 2024-07-10 NOTE — Anesthesia Procedure Notes (Signed)
 Procedure Name: Intubation Date/Time: 07/10/2024 11:17 AM  Performed by: Loreli Blima LABOR, CRNAPre-anesthesia Checklist: Patient identified, Emergency Drugs available, Suction available and Patient being monitored Patient Re-evaluated:Patient Re-evaluated prior to induction Oxygen Delivery Method: Circle System Utilized Preoxygenation: Pre-oxygenation with 100% oxygen Induction Type: IV induction Ventilation: Mask ventilation without difficulty Laryngoscope Size: Mac and 4 Grade View: Grade I Tube type: Oral Tube size: 7.5 mm Number of attempts: 1 Airway Equipment and Method: Stylet Placement Confirmation: ETT inserted through vocal cords under direct vision, positive ETCO2 and breath sounds checked- equal and bilateral Secured at: 24 cm Tube secured with: Tape Dental Injury: Teeth and Oropharynx as per pre-operative assessment

## 2024-07-10 NOTE — H&P (Signed)
 Chief Complaint: New Consultation       History of Present Illness: Evan Freeman is a 53 y.o. male who is seen today as an office consultation at the request of Dr. Amon for evaluation of New Consultation .   History of Present Illness Evan Freeman is a 53 year old male who presents for surgical evaluation of gallbladder polyps. He was referred by his primary care doctor for evaluation of gallbladder polyps.   His gallbladder has been monitored annually for polyps over the past four years. This year's ultrasound shows multiple polyps, whereas previous ultrasounds showed only one polyp. He experiences no pain under the ribs or any other abdominal pain. He has not undergone any previous abdominal surgeries.   He has hypertension. He does not have diabetes, heart, or lung problems and is not under cardiology care.   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   US  results:   IMPRESSION: 1. Possible multiple gallbladder polyps demonstrated on today's exam, largest measuring up to 7 mm. Comparison is difficult with prior exams given limited evaluation of the gallbladder secondary to habitus. Recommend follow-up right upper quadrant ultrasound in 6 months. 2. Increased hepatic parenchymal echogenicity suggestive of steatosis.     Review of Systems: A complete review of systems was obtained from the patient.  I have reviewed this information and discussed as appropriate with the patient.  See HPI as well for other ROS.   Review of Systems  Constitutional:  Negative for fever.  HENT:  Negative for congestion.   Eyes:  Negative for blurred vision.  Respiratory:  Negative for cough, shortness of breath and wheezing.   Cardiovascular:  Negative for chest pain and palpitations.  Gastrointestinal:  Negative for heartburn.  Genitourinary:  Negative for dysuria.  Musculoskeletal:  Negative for myalgias.  Skin:  Negative for rash.  Neurological:   Negative for dizziness and headaches.  Psychiatric/Behavioral:  Negative for depression and suicidal ideas.   All other systems reviewed and are negative.       Medical History:     Past Medical History:  Diagnosis Date   Arthritis     GERD (gastroesophageal reflux disease)     Hyperlipidemia        There is no problem list on file for this patient.     History reviewed. No pertinent surgical history.    No Known Allergies         Current Outpatient Medications on File Prior to Visit  Medication Sig Dispense Refill   amLODIPine  (NORVASC ) 10 MG tablet Take 10 mg by mouth once daily       atorvastatin  (LIPITOR) 40 MG tablet Take 40 mg by mouth at bedtime       losartan  (COZAAR ) 50 MG tablet Take 50 mg by mouth once daily        No current facility-administered medications on file prior to visit.           Family History  Problem Relation Age of Onset   High blood pressure (Hypertension) Mother     Heart valve disease Mother     Diabetes Mother        Social History        Tobacco Use  Smoking Status Former   Types: Cigarettes  Smokeless Tobacco Never      Social History         Socioeconomic History   Marital status: Married  Tobacco Use   Smoking status: Former  Types: Cigarettes   Smokeless tobacco: Never  Substance and Sexual Activity   Alcohol use: Yes      Alcohol/week: 4.0 - 14.0 standard drinks of alcohol      Types: 4 - 14 Standard drinks or equivalent per week   Drug use: Never    Social Drivers of Health          Received from [employer]    Social Network  Housing Stability: Unknown (06/15/2024)    Housing Stability Vital Sign     Homeless in the Last Year: No      Objective:         Vitals:    06/15/24 0850  BP: 136/87  Pulse: 85  Temp: 36.9 C (98.4 F)  SpO2: 96%  Weight: 88.5 kg (195 lb 3.2 oz)  Height: 162.6 cm (5' 4)  PainSc: 0-No pain    Body mass index is 33.51 kg/m.   Physical Exam Constitutional:       General: He is not in acute distress.    Appearance: Normal appearance.  HENT:     Head: Normocephalic.     Nose: No rhinorrhea.     Mouth/Throat:     Mouth: Mucous membranes are moist.     Pharynx: Oropharynx is clear.  Eyes:     General: No scleral icterus.    Pupils: Pupils are equal, round, and reactive to light.  Cardiovascular:     Rate and Rhythm: Normal rate.     Pulses: Normal pulses.  Pulmonary:     Effort: Pulmonary effort is normal. No respiratory distress.     Breath sounds: No stridor. No wheezing.  Abdominal:     General: Abdomen is flat. There is no distension.     Tenderness: There is no abdominal tenderness. There is no guarding or rebound.  Musculoskeletal:        General: Normal range of motion.     Cervical back: Normal range of motion and neck supple.  Skin:    General: Skin is warm and dry.     Capillary Refill: Capillary refill takes less than 2 seconds.     Coloration: Skin is not jaundiced.  Neurological:     General: No focal deficit present.     Mental Status: He is alert and oriented to person, place, and time. Mental status is at baseline.  Psychiatric:        Mood and Affect: Mood normal.        Thought Content: Thought content normal.        Judgment: Judgment normal.       Assessment and Plan:  Diagnoses and all orders for this visit:   Gallbladder polyp     Evan Freeman is a 53 y.o. male     We will proceed to the OR for a lap cholecystectomy. All risks and benefits were discussed with the patient to generally include: infection, bleeding, possible need for post op ERCP, damage to the bile ducts, and bile leak. Alternatives were offered and described.  All questions were answered and the patient voiced understanding of the procedure and wishes to proceed at this point with a laparoscopic cholecystectomy           No follow-ups on file.   Lynda Leos, MD, [facility], [place] [facility] Minimally  Invasive Surgery

## 2024-07-11 ENCOUNTER — Encounter (HOSPITAL_COMMUNITY): Payer: Self-pay | Admitting: General Surgery

## 2024-07-11 LAB — SURGICAL PATHOLOGY

## 2024-07-15 ENCOUNTER — Other Ambulatory Visit: Payer: Self-pay | Admitting: Internal Medicine

## 2024-08-10 ENCOUNTER — Ambulatory Visit

## 2024-11-30 ENCOUNTER — Ambulatory Visit: Admitting: Internal Medicine
